# Patient Record
Sex: Male | Born: 1960 | Race: White | Hispanic: No | Marital: Married | Smoking: Former smoker
Health system: Southern US, Community
[De-identification: ages and names within clinical notes are randomized; demographics above are authoritative.]

## PROBLEM LIST (undated history)

## (undated) DIAGNOSIS — K219 Gastro-esophageal reflux disease without esophagitis: Secondary | ICD-10-CM

## (undated) DIAGNOSIS — C3490 Malignant neoplasm of unspecified part of unspecified bronchus or lung: Secondary | ICD-10-CM

## (undated) DIAGNOSIS — Z923 Personal history of irradiation: Secondary | ICD-10-CM

## (undated) HISTORY — DX: Gastro-esophageal reflux disease without esophagitis: K21.9

## (undated) HISTORY — PX: APPENDECTOMY: SHX54

---

## 1999-07-30 ENCOUNTER — Encounter: Payer: Self-pay | Admitting: *Deleted

## 1999-07-30 ENCOUNTER — Emergency Department (HOSPITAL_COMMUNITY): Admission: EM | Admit: 1999-07-30 | Discharge: 1999-07-30 | Payer: Self-pay | Admitting: *Deleted

## 2013-07-09 ENCOUNTER — Encounter: Payer: Self-pay | Admitting: Internal Medicine

## 2013-07-09 ENCOUNTER — Ambulatory Visit (INDEPENDENT_AMBULATORY_CARE_PROVIDER_SITE_OTHER): Payer: BC Managed Care – PPO | Admitting: Internal Medicine

## 2013-07-09 ENCOUNTER — Ambulatory Visit (INDEPENDENT_AMBULATORY_CARE_PROVIDER_SITE_OTHER)
Admission: RE | Admit: 2013-07-09 | Discharge: 2013-07-09 | Disposition: A | Discharge status: Home / Self Care | Payer: BC Managed Care – PPO | Source: Ambulatory Visit | Attending: Internal Medicine | Admitting: Internal Medicine

## 2013-07-09 VITALS — BP 130/88 | HR 72 | Ht 71.5 in | Wt 176.4 lb

## 2013-07-09 DIAGNOSIS — Z720 Tobacco use: Secondary | ICD-10-CM

## 2013-07-09 DIAGNOSIS — R9389 Abnormal findings on diagnostic imaging of other specified body structures: Secondary | ICD-10-CM

## 2013-07-09 DIAGNOSIS — R918 Other nonspecific abnormal finding of lung field: Secondary | ICD-10-CM

## 2013-07-09 DIAGNOSIS — R1310 Dysphagia, unspecified: Secondary | ICD-10-CM

## 2013-07-09 DIAGNOSIS — F17200 Nicotine dependence, unspecified, uncomplicated: Secondary | ICD-10-CM

## 2013-07-09 NOTE — Assessment & Plan Note (Signed)
Chest x-ray abnormal, see report below. Plan: CT chest with   Pulmonary referral Patient aware he has 3 pulmonary nodules and needs further workup -- IMPRESSION:  Right upper and right lower lobe pulmonary nodules and possible  left lower lobe nodule with bulky mediastinal lymphadenopathy.  Findings are likely due to metastatic lung carcinoma. Recommend CT  chest with contrast for further evaluation.

## 2013-07-09 NOTE — Assessment & Plan Note (Addendum)
52 year old gentleman, heavy tobacco user weight 4-5 weeks history of dysphasia. He has a history of heartburn but symptoms are described as chronic but very mild. DDX includes peptic stricture, esophagitis malignancy,  others. Plan:  Labs, Chest x-ray, PPIs GI referral Addendum: Chest x-ray abnormal, cancel GI referral. See below

## 2013-07-09 NOTE — Progress Notes (Signed)
  Subjective:    Patient ID: Ray Burns, male    DOB: 1961-11-11, 52 y.o.   MRN: [id number]  HPI New patient, here with following concern. About 5 weeks ago he was eating a [employer], swallow a piece of a sandwich but it got "stuck" on his chest and he had difficulty swallowing, he had to take some fluids and eventually it "squeezed into the stomach". Ever since that episode he has dysphagia to solids and liquids described as difficulty swallowing, some pressure. Also his stomach is slightly bloated. He has a history of heartburn for a long time but symptoms are actually minimal, he has not reached for medication in a while. He is a heavy tobacco user, change smoking to smokeless tobacco 4 years ago.  Past Medical History  Diagnosis Date  . GERD (gastroesophageal reflux disease)     mild   Past Surgical History  Procedure Laterality Date  . Appendectomy      52 y/o   History   Social History  . Marital Status: Single    Spouse Name: N/A    Number of Children: 3  . Years of Education: N/A   Occupational History  . carpenter     Social History Main Topics  . Smoking status: Former Games developer  . Smokeless tobacco: Current User     Comment: smoked 1 ppd, change to smokeless ~ 2010  . Alcohol Use: Yes     Comment: 3 times a week, 6 beers  . Drug Use: Yes  . Sexually Active: Not on file   Other Topics Concern  . Not on file   Social History Narrative   Married    Family History  Problem Relation Age of Onset  . CAD Father   . Stroke Neg Hx   . Diabetes Mother   . Lung cancer Father     smoker  . Prostate cancer Neg Hx   . Colon cancer Sister      Review of Systems No fever or chills, no weight loss. No  nausea, vomiting, diarrhea or change in the color of the stools. No history of peptic ulcer disease. Not taken any Motrin or similar medications. Occasional cough ever since his symptoms started. No hemoptysis. Never had a EGD    Objective:   Physical Exam BP  130/88  Pulse 72  Ht 5' 11.5" (1.816 m)  Wt 176 lb 6.4 oz (80.015 kg)  BMI 24.26 kg/m2  SpO2 95%  General -- alert, well-developed, NAD.   Neck --no thyromegaly , no mass or neck LADs; No supraclavicular mass. HEENT --  Poor dentition Lungs -- normal respiratory effort, no intercostal retractions, no accessory muscle use, and normal breath sounds.   Heart-- normal rate, regular rhythm, no murmur, and no gallop.   Abdomen--soft, non-tender, no distention, no masses, no HSM, no guarding, and no rigidity.   Extremities-- no pretibial edema bilaterally Psych-- Cognition and judgment appear intact. Alert and cooperative with normal attention span and concentration.  not anxious appearing and not depressed appearing.       Assessment & Plan:

## 2013-07-09 NOTE — Assessment & Plan Note (Signed)
Risk of oral, pharyngeal , lung cancer discussed. Information provided about quitting. Will discuss further when he comes back to the office. Recommend to see his dentist routinely.

## 2013-07-09 NOTE — Patient Instructions (Addendum)
Prilosec 20 mg OTC one tablet before breakfast every day. We are sending you to her stomach doctor., if you don't hear from [place] in 5 days, please call our office. Think about quitting tobacco You need to see a dentist for regular dental care Schedule a complete physical at your convenience --- Please get your x-ray at the other [place]  office located at: [address] [place], across from [facility].  Please go to the basement, this is a walk-in facility, they are open from 8:30 to 5:30 PM. Phone number [phone].

## 2013-07-10 LAB — CBC WITH DIFFERENTIAL/PLATELET
Basophils Absolute: 0 10*3/uL (ref 0.0–0.1)
Basophils Relative: 0.3 % (ref 0.0–3.0)
Eosinophils Absolute: 0.2 10*3/uL (ref 0.0–0.7)
Eosinophils Relative: 1.7 % (ref 0.0–5.0)
HCT: 49.2 % (ref 39.0–52.0)
Hemoglobin: 16.6 g/dL (ref 13.0–17.0)
Lymphocytes Relative: 20.9 % (ref 12.0–46.0)
Lymphs Abs: 2.1 10*3/uL (ref 0.7–4.0)
MCHC: 33.8 g/dL (ref 30.0–36.0)
MCV: 96.3 fl (ref 78.0–100.0)
Monocytes Absolute: 0.7 10*3/uL (ref 0.1–1.0)
Monocytes Relative: 6.9 % (ref 3.0–12.0)
Neutro Abs: 7.1 10*3/uL (ref 1.4–7.7)
Neutrophils Relative %: 70.2 % (ref 43.0–77.0)
Platelets: 216 10*3/uL (ref 150.0–400.0)
RBC: 5.11 Mil/uL (ref 4.22–5.81)
RDW: 12.5 % (ref 11.5–14.6)
WBC: 10.1 10*3/uL (ref 4.5–10.5)

## 2013-07-10 LAB — COMPREHENSIVE METABOLIC PANEL
ALT: 22 U/L (ref 0–53)
AST: 24 U/L (ref 0–37)
Albumin: 4.4 g/dL (ref 3.5–5.2)
Alkaline Phosphatase: 62 U/L (ref 39–117)
BUN: 17 mg/dL (ref 6–23)
CO2: 27 mEq/L (ref 19–32)
Calcium: 9.8 mg/dL (ref 8.4–10.5)
Chloride: 102 mEq/L (ref 96–112)
Creatinine, Ser: 1 mg/dL (ref 0.4–1.5)
GFR: 86.42 mL/min (ref >60.00)
Glucose, Bld: 82 mg/dL (ref 70–99)
Potassium: 4.6 mEq/L (ref 3.5–5.1)
Sodium: 136 mEq/L (ref 135–145)
Total Bilirubin: 0.8 mg/dL (ref 0.3–1.2)
Total Protein: 7.6 g/dL (ref 6.0–8.3)

## 2013-07-10 LAB — TSH: TSH: 1.43 u[IU]/mL (ref 0.35–5.50)

## 2013-07-12 ENCOUNTER — Ambulatory Visit (INDEPENDENT_AMBULATORY_CARE_PROVIDER_SITE_OTHER)
Admission: RE | Admit: 2013-07-12 | Discharge: 2013-07-12 | Disposition: A | Discharge status: Home / Self Care | Payer: BC Managed Care – PPO | Source: Ambulatory Visit | Attending: Internal Medicine | Admitting: Internal Medicine

## 2013-07-12 ENCOUNTER — Telehealth: Payer: Self-pay | Admitting: Internal Medicine

## 2013-07-12 DIAGNOSIS — R9389 Abnormal findings on diagnostic imaging of other specified body structures: Secondary | ICD-10-CM

## 2013-07-12 DIAGNOSIS — R918 Other nonspecific abnormal finding of lung field: Secondary | ICD-10-CM

## 2013-07-12 MED ORDER — IOHEXOL 300 MG/ML  SOLN
100.0000 mL | Freq: Once | INTRAMUSCULAR | Status: AC | PRN
  Administered 2013-07-12: 80 mL via INTRAVENOUS

## 2013-07-12 NOTE — Telephone Encounter (Signed)
I asked the patient to come to the office to discuss the CT results, he is here today with his wife.  I had a frank conversation with him, findings are consistent with lung cancer but we need to wait until we have a tissue diagnosis. I counseled  him to the best of my ability, reassured him there is excellent care here in [place]. I asked him to call me at anytime if he feels is getting severely depress, anxious or unable to sleep; Both the patient and his wife think  they will  be "okay". Again they will call me if needed.

## 2013-07-19 ENCOUNTER — Ambulatory Visit (INDEPENDENT_AMBULATORY_CARE_PROVIDER_SITE_OTHER): Payer: BC Managed Care – PPO | Admitting: Internal Medicine

## 2013-07-19 ENCOUNTER — Encounter: Payer: Self-pay | Admitting: Internal Medicine

## 2013-07-19 VITALS — BP 120/80 | HR 78 | Temp 98.3°F | Ht 70.5 in | Wt 179.4 lb

## 2013-07-19 DIAGNOSIS — R918 Other nonspecific abnormal finding of lung field: Secondary | ICD-10-CM

## 2013-07-19 DIAGNOSIS — R1310 Dysphagia, unspecified: Secondary | ICD-10-CM

## 2013-07-19 DIAGNOSIS — R222 Localized swelling, mass and lump, trunk: Secondary | ICD-10-CM

## 2013-07-19 NOTE — Assessment & Plan Note (Signed)
Explained by subcarinal mass > rec chewing solids, pushing more liquids until we have a change to make tissue dx and start treatment.

## 2013-07-19 NOTE — Assessment & Plan Note (Signed)
This is almost certainly Stage IV lung ca, probably an adencarcinoma > Discussed in detail all the  indications, usual  risks and alternatives  relative to the benefits with patient who agrees to proceed with bronchoscopy with biopsy.

## 2013-07-19 NOTE — Patient Instructions (Signed)
Bronchoscopy is done as outpatient (outpatient registration entrance) and nothing to eat or drink after midnight Wednesday and arrive 7 am 8/7

## 2013-07-19 NOTE — Progress Notes (Signed)
  Subjective:    Patient ID: Ray Burns, male    DOB: 07-Nov-1961 MRN: [id number]  HPI  89 yowm quit smoking 2010 with breathng difficulty resolved completely then new onset dysphagia 05/2013 with lung mass on cxr > referred by Dr [name] 07/19/2013 for pulmonary eval   07/19/2013 1st pulmonary eval cc new onset dysphagia, cough, sob x 2 months indolent onset progressive but solids= liquid and no lknown wt loss. 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 No unusual exp hx or h/o childhood pna/ asthma or knowledge of premature birth.   Sleeping ok without nocturnal  or early am exacerbation  of respiratory  c/o's or need for noct saba. Also denies any obvious fluctuation of symptoms with weather or environmental changes or other aggravating or alleviating factors except as outlined above     Review of Systems  Constitutional: Negative for fever, chills, activity change, appetite change and unexpected weight change.  HENT: Positive for trouble swallowing. Negative for congestion, sore throat, rhinorrhea, sneezing, dental problem, voice change and postnasal drip.   Eyes: Negative for visual disturbance.  Respiratory: Positive for cough and shortness of breath. Negative for choking.   Cardiovascular: Negative for chest pain and leg swelling.  Gastrointestinal: Negative for nausea, vomiting and abdominal pain.  Genitourinary: Negative for difficulty urinating.  Musculoskeletal: Negative for arthralgias.  Skin: Negative for rash.  Psychiatric/Behavioral: Negative for behavioral problems and confusion.       Objective:   Physical Exam  Anxious but very pleasant amb wm nad Wt Readings from Last 3 Encounters:  07/19/13 179 lb 6.4 oz (81.375 kg)  07/09/13 176 lb 6.4 oz (80.015 kg)     HEENT mild turbinate edema.  Oropharynx no thrush or excess pnd or  cobblestoning.  No JVD or cervical adenopathy. Mild accessory muscle hypertrophy. Trachea midline, nl thryroid. Chest was hyperinflated by percussion with diminished breath sounds and moderate increased exp time without wheeze. Hoover sign positive at mid inspiration. Regular rate and rhythm without murmur gallop or rub or increase P2 or edema.  Abd: no hsm, nl excursion. Ext warm without cyanosis or clubbing.     Ct  07/12/13 1. Examination is positive for bilateral pulmonary metastases.  2. There is a very large subcarinal mass which has mass effect  upon the esophagus and the bronchi. This likely accounts for the  patient's dysphagia.  3. Mediastinal, bilateral hilar lymph, and gastrohepatic ligament  node metastases.  4. Right adrenal gland metastases       Assessment & Plan:

## 2013-07-25 ENCOUNTER — Encounter (HOSPITAL_COMMUNITY)
Admission: RE | Disposition: A | Discharge status: Home / Self Care | Payer: Self-pay | Source: Ambulatory Visit | Attending: Internal Medicine

## 2013-07-25 ENCOUNTER — Ambulatory Visit (HOSPITAL_COMMUNITY)
Admission: RE | Admit: 2013-07-25 | Discharge: 2013-07-25 | Disposition: A | Discharge status: Home / Self Care | Payer: BC Managed Care – PPO | Source: Ambulatory Visit | Attending: Internal Medicine | Admitting: Internal Medicine

## 2013-07-25 ENCOUNTER — Encounter (HOSPITAL_COMMUNITY): Payer: Self-pay | Admitting: Respiratory Therapy

## 2013-07-25 DIAGNOSIS — R918 Other nonspecific abnormal finding of lung field: Secondary | ICD-10-CM

## 2013-07-25 DIAGNOSIS — R222 Localized swelling, mass and lump, trunk: Secondary | ICD-10-CM

## 2013-07-25 DIAGNOSIS — Z87891 Personal history of nicotine dependence: Secondary | ICD-10-CM | POA: Insufficient documentation

## 2013-07-25 DIAGNOSIS — C3490 Malignant neoplasm of unspecified part of unspecified bronchus or lung: Secondary | ICD-10-CM | POA: Insufficient documentation

## 2013-07-25 HISTORY — PX: VIDEO BRONCHOSCOPY: SHX5072

## 2013-07-25 SURGERY — VIDEO BRONCHOSCOPY WITHOUT FLUORO
Anesthesia: Moderate Sedation | Laterality: Bilateral

## 2013-07-25 MED ORDER — PHENYLEPHRINE HCL 0.25 % NA SOLN
1.0000 | Freq: Four times a day (QID) | NASAL | Status: DC | PRN
  Filled 2013-07-25: qty 15

## 2013-07-25 MED ORDER — LIDOCAINE HCL 2 % EX GEL
CUTANEOUS | Status: DC | PRN
  Administered 2013-07-25: 1 via TOPICAL

## 2013-07-25 MED ORDER — PHENYLEPHRINE HCL 0.25 % NA SOLN: 1.0000 | Freq: Four times a day (QID) | NASAL | Status: DC | PRN

## 2013-07-25 MED ORDER — LIDOCAINE HCL (PF) 1 % IJ SOLN: INTRAMUSCULAR | Status: DC | PRN

## 2013-07-25 MED ORDER — LIDOCAINE HCL 2 % EX GEL: Freq: Once | CUTANEOUS | Status: DC

## 2013-07-25 MED ORDER — MEPERIDINE HCL 25 MG/ML IJ SOLN
INTRAMUSCULAR | Status: DC | PRN
  Administered 2013-07-25: 50 mg via INTRAVENOUS

## 2013-07-25 MED ORDER — MIDAZOLAM HCL 10 MG/2ML IJ SOLN
INTRAMUSCULAR | Status: AC
  Filled 2013-07-25: qty 4

## 2013-07-25 MED ORDER — SODIUM CHLORIDE 0.9 % IV SOLN
INTRAVENOUS | Status: DC
  Administered 2013-07-25: 08:00:00 via INTRAVENOUS

## 2013-07-25 MED ORDER — PHENYLEPHRINE HCL 0.25 % NA SOLN
NASAL | Status: DC | PRN
  Administered 2013-07-25: 2

## 2013-07-25 MED ORDER — LIDOCAINE HCL 1 % IJ SOLN
INTRAMUSCULAR | Status: DC | PRN
  Administered 2013-07-25: 6 mL via RESPIRATORY_TRACT

## 2013-07-25 MED ORDER — MEPERIDINE HCL 100 MG/ML IJ SOLN
INTRAMUSCULAR | Status: AC
  Filled 2013-07-25: qty 2

## 2013-07-25 MED ORDER — EPINEPHRINE HCL 0.1 MG/ML IJ SOSY
PREFILLED_SYRINGE | INTRAMUSCULAR | Status: DC | PRN
  Administered 2013-07-25: 1 mg via INTRAVENOUS

## 2013-07-25 MED ORDER — LIDOCAINE HCL 2 % EX GEL
Freq: Once | CUTANEOUS | Status: DC
  Filled 2013-07-25: qty 5

## 2013-07-25 MED ORDER — MIDAZOLAM HCL 10 MG/2ML IJ SOLN
INTRAMUSCULAR | Status: DC | PRN
  Administered 2013-07-25 (×2): 2.5 mg via INTRAVENOUS

## 2013-07-25 NOTE — Progress Notes (Signed)
Video Bronchoscopy Done  Intervention Bronchial biopsy Intervention Bronchial washing   Procedure tolerated well

## 2013-07-25 NOTE — Op Note (Signed)
Bronchoscopy Procedure Note  Date of Operation: 07/25/2013   Pre-op Diagnosis: Lung mass  Post-op Diagnosis: same  Surgeon: Sandrea Hughs  Anesthesia: Monitored Local Anesthesia with Sedation  Operation: Video Flexible fiberoptic bronchoscopy, diagnostic   Findings: Studding of Bronchus intermedius with fungating polypoid lesions, no airway obst  Specimen: Endobronchial Bx, washing  Estimated Blood Loss: min   Complications: min bleeding at bx site, controlled with epi x one  Indications and History: See updated H and P same date. The risks, benefits, complications, treatment options and expected outcomes were discussed with the patient.  The possibilities of reaction to medication, pulmonary aspiration, perforation of a viscus, bleeding, failure to diagnose a condition and creating a complication requiring transfusion or operation were discussed with the patient who freely signed the consent.    Description of Procedure: The patient was re-examined in the bronchoscopy suite and the site of surgery properly noted/marked.  The patient was identified  and the procedure verified as Flexible Fiberoptic Bronchoscopy.  A Time Out was held and the above information confirmed.   After the induction of topical nasopharyngeal anesthesia, the patient was positioned  and the bronchoscope was passed through the R naris. The vocal cords were visualized and  1% buffered lidocaine 5 ml was topically placed onto the cords. The cords were nl. The scope was then passed into the trachea.  1% buffered lidocaine given topically. Airways inspected bilaterally to the subsegmental level with the following findings:  Airways nl to subsegmental level x for several endobronchial polypoid lesions BI with friable mucosa, cauliflower in texture  Interventions Washing BI Endobronchial bx x 5    The Patient was taken to the Endoscopy Recovery area in satisfactory condition.  Attestation: I performed the  procedure.  Sandrea Hughs, MD Pulmonary and Critical Care Medicine [facility] Cell [phone] After 5:30 PM or weekends, call [phone]

## 2013-07-25 NOTE — H&P (Signed)
pHPI   51 yowm quit smoking 2010 with breathng difficulty resolved completely then new onset dysphagia 05/2013 with lung mass on cxr > referred by Dr [name] 07/19/2013 for pulmonary eval    07/19/2013 1st pulmonary eval cc new onset dysphagia, cough, sob x 2 months indolent onset progressive but solids= liquid and no lknown wt loss. 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 No unusual exp hx or h/o childhood pna/ asthma or knowledge of premature birth.    Sleeping ok without nocturnal  or early am exacerbation  of respiratory  c/o's or need for noct saba. Also denies any obvious fluctuation of symptoms with weather or environmental changes or other aggravating or alleviating factors except as outlined above        Review of Systems  Constitutional: Negative for fever, chills, activity change, appetite change and unexpected weight change.  HENT: Positive for trouble swallowing. Negative for congestion, sore throat, rhinorrhea, sneezing, dental problem, voice change and postnasal drip.   Eyes: Negative for visual disturbance.  Respiratory: Positive for cough and shortness of breath. Negative for choking.   Cardiovascular: Negative for chest pain and leg swelling.  Gastrointestinal: Negative for nausea, vomiting and abdominal pain.  Genitourinary: Negative for difficulty urinating.  Musculoskeletal: Negative for arthralgias.  Skin: Negative for rash.  Psychiatric/Behavioral: Negative for behavioral problems and confusion.          Objective:     Physical Exam   Anxious but very pleasant amb wm nad Wt Readings from Last 3 Encounters:   07/19/13  179 lb 6.4 oz (81.375 kg)   07/09/13  176 lb 6.4 oz (80.015 kg)        HEENT mild turbinate edema.  Oropharynx no thrush or excess pnd or cobblestoning.  No JVD or cervical adenopathy. Mild  accessory muscle hypertrophy. Trachea midline, nl thryroid. Chest was hyperinflated by percussion with diminished breath sounds and moderate increased exp time without wheeze. Hoover sign positive at mid inspiration. Regular rate and rhythm without murmur gallop or rub or increase P2 or edema.  Abd: no hsm, nl excursion. Ext warm without cyanosis or clubbing.       Ct  07/12/13 1. Examination is positive for bilateral pulmonary metastases.   2. There is a very large subcarinal mass which has mass effect   upon the esophagus and the bronchi. This likely accounts for the   patient's dysphagia.   3. Mediastinal, bilateral hilar lymph, and gastrohepatic ligament   node metastases.   4. Right adrenal gland metastases           Assessment & Plan:     Lung mass -            This is almost certainly Stage IV lung ca, probably an adencarcinoma > Discussed in detail all the  indications, usual  risks and alternatives  relative to the benefits with patient who agrees to proceed with bronchoscopy with biopsy.           Dysphagia,         Explained by subcarinal mass > rec chewing solids, pushing more liquids until we have a change to make tissue dx and start treatment.               Ray Hughs, MD Pulmonary and Critical Care Medicine [facility] Cell [phone] After 5:30  PM or weekends, call [phone]

## 2013-07-26 ENCOUNTER — Encounter (HOSPITAL_COMMUNITY): Payer: Self-pay | Admitting: Internal Medicine

## 2013-07-29 ENCOUNTER — Encounter: Payer: Self-pay | Admitting: Internal Medicine

## 2013-07-29 ENCOUNTER — Telehealth: Payer: Self-pay | Admitting: Internal Medicine

## 2013-07-29 ENCOUNTER — Other Ambulatory Visit: Payer: Self-pay | Admitting: Internal Medicine

## 2013-07-29 DIAGNOSIS — R918 Other nonspecific abnormal finding of lung field: Secondary | ICD-10-CM

## 2013-07-29 NOTE — Telephone Encounter (Signed)
Pt is requesting BX results. Please advise MW thanks

## 2013-07-30 ENCOUNTER — Telehealth: Payer: Self-pay | Admitting: *Deleted

## 2013-07-30 NOTE — Telephone Encounter (Signed)
Spoke with wife regarding [facility] appt 08/01/13 at 1:00 arrive at 12:45.  She verbalized understanding of time and place of appt

## 2013-08-01 ENCOUNTER — Encounter: Payer: Self-pay | Admitting: *Deleted

## 2013-08-01 ENCOUNTER — Ambulatory Visit
Admission: RE | Admit: 2013-08-01 | Discharge: 2013-08-01 | Disposition: A | Discharge status: Home / Self Care | Payer: BC Managed Care – PPO | Source: Ambulatory Visit | Attending: Radiation Oncology | Admitting: Radiation Oncology

## 2013-08-01 ENCOUNTER — Encounter: Payer: Self-pay | Admitting: Internal Medicine

## 2013-08-01 ENCOUNTER — Ambulatory Visit: Payer: BC Managed Care – PPO | Admitting: Physical Therapy

## 2013-08-01 ENCOUNTER — Ambulatory Visit (HOSPITAL_BASED_OUTPATIENT_CLINIC_OR_DEPARTMENT_OTHER): Payer: BC Managed Care – PPO | Admitting: Internal Medicine

## 2013-08-01 VITALS — BP 134/83 | HR 82 | Temp 98.5°F | Resp 18 | Ht 70.5 in | Wt 174.6 lb

## 2013-08-01 VITALS — BP 131/85 | HR 72 | Temp 98.6°F | Ht 70.5 in | Wt 174.8 lb

## 2013-08-01 DIAGNOSIS — K208 Other esophagitis without bleeding: Secondary | ICD-10-CM | POA: Insufficient documentation

## 2013-08-01 DIAGNOSIS — Z79899 Other long term (current) drug therapy: Secondary | ICD-10-CM | POA: Insufficient documentation

## 2013-08-01 DIAGNOSIS — C3490 Malignant neoplasm of unspecified part of unspecified bronchus or lung: Secondary | ICD-10-CM | POA: Insufficient documentation

## 2013-08-01 DIAGNOSIS — Z510 Encounter for antineoplastic radiation therapy: Secondary | ICD-10-CM | POA: Insufficient documentation

## 2013-08-01 DIAGNOSIS — R1310 Dysphagia, unspecified: Secondary | ICD-10-CM

## 2013-08-01 HISTORY — DX: Malignant neoplasm of unspecified part of unspecified bronchus or lung: C34.90

## 2013-08-01 NOTE — Progress Notes (Signed)
[facility] [phone]   [phone]  CONSULT NOTE  REFERRING PHYSICIAN: Dr. Sandrea Hughs  REASON FOR CONSULTATION:  52 years old white male recently diagnosed with lung cancer  HPI BLAIDEN Burns is a 52 y.o. male with no significant past medical history but long history of smoking and quit in 2010. The patient mentions that for a couple of months he has been complaining of difficulty swallowing and food stuck at the middle of his esophagus. He was seen by his primary care physician Dr. [name] and chest x-ray was performed on 07/09/2013 and showed the right upper and right lower lobe pulmonary nodules and possible left lower lobe nodules with bulky mediastinal lymphadenopathy suspicious for metastatic lung cancer. This was followed by CT scan of the chest with contrast on 07/12/2013 and it showed bilateral pulmonary nodules are identified. Nodule in the right lower lobe measures 1.6 cm. The left upper lobe nodule measures 1.8 cm. In the left lower lobe there is a 1.6 cm nodule. There is mediastinal and bilateral hilar adenopathy. Large subcarinal lymph node measures 4.8 cm. This is marked mass effect  upon the esophagus. There is also mass effect upon both bronchi and lower lobe airways. Index right hilar lymph node measures 2.3 cm. Index left hilar lymph node measures 1.1 cm. There is no axillary or supraclavicular adenopathy. Imaging through the abdomen shows a large right adrenal nodule which measures 2.4 x 3.3 cm. There is a gastrohepatic ligament lymph node which measures 2.9 cm. The patient was referred to Dr. [name] and on 07/25/2013 he underwent bronchoscopy with endobronchial biopsy and washings.  The final pathology (Accession: [id number]) was consistent with small cell carcinoma. There is an atypical infiltrate with features of small cell carcinoma confirmed by positive staining with synaptophysin and thyroid transcription factor-1 (TTF-1). The infiltrate  is negative with chromogranin, CD56, leukocyte common antigen (CD45) and cytokeratin AE1/AE3. Dr. [name] kindly referred the patient to me today for further evaluation and recommendation regarding treatment of his condition. The patient continues to have pain in the center of his chest as well as difficulty swallowing but no significant weight loss. He also continues to have dry cough with shortness breath with exertion but no hemoptysis. He denied having any significant headache or visual changes. Family history significant for a father who died from lung cancer and a sister with colon cancer. The patient is married and was accompanied today by his wife, [name]. He has 3 children age 35, 3 and 6 weeks. He works as a Music therapist. He has a history of smoking one pack per day for around 30 years but quit in 2010. He also chews tobacco and drinks 1-2 alcoholic drinks every other day. He has no history of drug abuse.    @SFHPI @  Past Medical History  Diagnosis Date  . GERD (gastroesophageal reflux disease)     mild    Past Surgical History  Procedure Laterality Date  . Appendectomy      52 y/o  . Video bronchoscopy Bilateral 07/25/2013    Procedure: VIDEO BRONCHOSCOPY WITHOUT FLUORO;  Surgeon: [name], MD;  Location: [facility];  Service: Cardiopulmonary;  Laterality: Bilateral;    Family History  Problem Relation Age of Onset  . CAD Father   . Stroke Neg Hx   . Diabetes Mother   . Lung cancer Father     smoker  . Prostate cancer Neg Hx   . Colon cancer Sister  Social History History  Substance Use Topics  . Smoking status: Former Smoker -- 1.00 packs/day for 30 years    Types: Cigarettes    Quit date: 12/19/2008  . Smokeless tobacco: Current User    Types: Chew  . Alcohol Use: Yes     Comment: 3 times a week, 6 beers    No Known Allergies  Current Outpatient Prescriptions  Medication Sig Dispense Refill  . omeprazole (PRILOSEC OTC) 20 MG tablet Take 20 mg  by mouth daily.       No current facility-administered medications for this visit.    Review of Systems  A comprehensive review of systems was negative except for: Constitutional: positive for fatigue Respiratory: positive for cough and dyspnea on exertion Gastrointestinal: positive for dyspepsia and dysphagia  Physical Exam  WJX:BJYNW, healthy, no distress, well nourished and well developed SKIN: skin color, texture, turgor are normal HEAD: Normocephalic, No masses, lesions, tenderness or abnormalities EYES: normal, PERRLA EARS: External ears normal OROPHARYNX:no exudate and no erythema  NECK: supple, no adenopathy LYMPH:  no palpable lymphadenopathy, no hepatosplenomegaly LUNGS: clear to auscultation  HEART: regular rate & rhythm, no murmurs and no gallops ABDOMEN:abdomen soft, non-tender, normal bowel sounds and no masses or organomegaly BACK: Back symmetric, no curvature. EXTREMITIES:no joint deformities, effusion, or inflammation, no edema, no skin discoloration  NEURO: alert & oriented x 3 with fluent speech, no focal motor/sensory deficits, gait normal Skin exam showed a palpable mass under the skin of back of the left shoulder with oozing of pus when pressured. The patient mentions that he has a poor more than one year.  PERFORMANCE STATUS: ECOG 1  LABORATORY DATA: Lab Results  Component Value Date   WBC 10.1 07/09/2013   HGB 16.6 07/09/2013   HCT 49.2 07/09/2013   MCV 96.3 07/09/2013   PLT 216.0 07/09/2013      Chemistry      Component Value Date/Time   NA 136 07/09/2013 1446   K 4.6 07/09/2013 1446   CL 102 07/09/2013 1446   CO2 27 07/09/2013 1446   BUN 17 07/09/2013 1446   CREATININE 1.0 07/09/2013 1446      Component Value Date/Time   CALCIUM 9.8 07/09/2013 1446   ALKPHOS 62 07/09/2013 1446   AST 24 07/09/2013 1446   ALT 22 07/09/2013 1446   BILITOT 0.8 07/09/2013 1446       RADIOGRAPHIC STUDIES: Dg Chest 2 View  07/09/2013   *RADIOLOGY REPORT*  Clinical  Data: Patient.  CHEST - 2 VIEW  Comparison: None.  Findings: The patient has a right upper lobe pulmonary nodule measuring 2.5 cm in diameter.  A second nodular opacity projects in the right lower lobe and measures 1.5 cm. A third nodular opacity projects over the lower left chest.  This could be within the lung parenchyma but may be the patient's nipple.  Calcified granuloma left upper lobe noted.  There is bulky mediastinal lymphadenopathy. No pleural effusion or pneumothorax is seen.  Heart size is normal.  IMPRESSION: Right upper and right lower lobe pulmonary nodules and possible left lower lobe nodule with bulky mediastinal lymphadenopathy. Findings are likely due to metastatic lung carcinoma.  Recommend CT chest with contrast for further evaluation.  These results will be called to the ordering clinician or representative by the Radiologist Assistant, and communication documented in the PACS Dashboard.   Original Report Authenticated By: Holley Dexter, M.D.   Ct Chest W Contrast  07/12/2013   *RADIOLOGY REPORT*  Clinical Data: Smoker.  Evaluate for cancer.  CT CHEST WITH CONTRAST  Technique:  Multidetector CT imaging of the chest was performed following the standard protocol during bolus administration of intravenous contrast.  Contrast: 80mL OMNIPAQUE IOHEXOL 300 MG/ML  SOLN  Comparison: None.  Findings: No pleural effusion identified.  Bilateral pulmonary nodules are identified.  Nodule in the right lower lobe measures 1.6 cm, image 49/series 3.  The left upper lobe nodule measures 1.8 cm, image 21/series 3.  In the left lower lobe there is a 1.6 cm nodule, image 47/series 3.  The heart size is normal.  No pericardial effusion identified. There is mediastinal and bilateral hilar adenopathy.  Large subcarinal lymph node measures 4.8 cm.  This is marked mass effect upon the esophagus.  There is also mass effect upon both bronchi and lower lobe airways.  Index right hilar lymph node measures 2.3 cm,  image 30/series 2.  Index left hilar lymph node measures 1.1 cm, image 36/series 2.  There is no axillary or supraclavicular adenopathy.  Imaging through the abdomen shows a large right adrenal nodule which measures 2.4 x 3.3 cm, image 62/series 2.  There is a gastrohepatic ligament lymph node which measures 2.9 cm, image 59/series 2.  No focal liver lesions are identified.  Calcified granulomas are identified within the spleen.  Within the subcutaneous soft tissues of the posterior chest wall there is a 2.2 cm, peripherally enhancing structure, image 5/series 2.  This may represent either a sebaceous cyst or area of metastasis.  Review of the visualized bony structures is negative for aggressive lytic or sclerotic bone lesions.  IMPRESSION:  1.  Examination is positive for bilateral pulmonary metastases. 2.  There is a very large subcarinal mass which has mass effect upon the esophagus and the bronchi.  This likely accounts for the patient's dysphagia. 3.  Mediastinal, bilateral hilar lymph, and gastrohepatic ligament node metastases. 4.  Right adrenal gland metastases   Original Report Authenticated By: Signa Kell, M.D.    ASSESSMENT: This is a very pleasant unfortunate 52 years old white male recently diagnosed with extensive stage small cell lung cancer presented with large subcarinal lymphadenopathy causing dysphagia and shortness of breath in addition to bilateral pulmonary nodules as well as retroperitoneal and adrenal gland metastasis.   PLAN: I had a lengthy discussion with the patient and his wife today about his current disease stage, prognosis and treatment options. I will complete the staging workup by ordering a PET scan as well as MRI of the brain.  I will refer the patient to Gen. Surgery for consideration of excision of the subcutaneous left shoulder cyst before starting chemotherapy. I would also referred the patient to radiation oncology and he would be seen later today by Dr. Mitzi Hansen  for consideration of palliative radiotherapy to the mediastinal lymph nodes for relief of the dysphagia and shortness of breath. I have a lengthy discussion with the patient about his treatment options including systemic chemotherapy versus palliative care and hospice. The patient is interested in systemic chemotherapy and I would consider him for a regimen consisting of carboplatin for AUC of 5 on day 1 and etoposide at 120 mg/M2 on days 1, 2 and 3 with Neulasta support on day 4.. I expect the patient to start the first cycle of this treatment on 08/12/2013 after completion of the staging workup and receiving some palliative radiotherapy. I discussed with the patient adverse effect of the chemotherapy including but not limited to alopecia, myelosuppression, nausea and vomiting, peripheral neuropathy,  liver or renal dysfunction. The patient would like to proceed with treatment as planned. He would come back for follow up visit with the start of the first cycle of his chemotherapy I will call his pharmacy with prescription for Compazine 10 mg by mouth every 6 hours as needed for nausea. The patient voices understanding of current disease status and treatment options and is in agreement with the current care plan.  All questions were answered. The patient knows to call the clinic with any problems, questions or concerns. We can certainly see the patient much sooner if necessary.  Thank you so much for allowing me to participate in the care of Ray Burns. I will continue to follow up the patient with you and assist in his care.  I spent 45 minutes counseling the patient face to face. The total time spent in the appointment was 60 minutes.  Kamarius Buckbee K. 08/01/2013, 1:18 PM

## 2013-08-01 NOTE — Progress Notes (Signed)
Ray Burns here for nurse evaluation after CT SIM.  He is alert and oriented to person, place and time.  He denies pain.

## 2013-08-01 NOTE — Progress Notes (Signed)
[facility] Clinical Social Work  Clinical Social Work met with patient and patient spouse during [place] for assessment of psychosocial needs. Medical oncologist reviewed patient's diagnosis and treatment plan with patient/family.  Mr. Duffus had minimal questions, patient's spouse expresses feeling "in shock" at the moment regarding patient diagnosis.  The patient desires to work as long as possible, CSW briefly discussed options for disability assistance if needed. Mr. Katich has three children ages 78, 35, and 7 weeks.  Mrs. Lowden shares patient's 52 year old is very "compassionate" and often worries.  CSW discussed developmentally appropriate ways to communicate with children about cancer.  CSW will follow up with patient and spouse to provide additional information.   Kathrin Penner, MSW, LCSW Clinical Social Worker [facility] [phone]

## 2013-08-01 NOTE — Progress Notes (Signed)
   Thoracic Treatment Summary Name: Ray Burns Date: 08/01/13 DOB: 06/06/1961 Your Medical Team Medical Oncologist: Dr. Arbutus Ped Radiation Oncologist: Dr. Mitzi Hansen Pulmonologist: Dr. [name] Surgeon: Type and Stage of Lung Cancer Small Cell Carcinoma: Extensive Stage   Clinical stage is based on radiology exams.  Pathological stage will be determined after surgery.  Staging is based on the size of the tumor, involvement of lymph nodes or not, and whether or not the cancer center has spread. Recommendations Recommendations: Radiation therapy, Chemotherapy,  Cyst evaluated by [facility]  These recommendations are based on information available as of today's consult.  This is subject to change depending further testing or exams. Next Steps Next Step: 1. Radiation therapy today 2. Scheduler for Medical oncology will set up appointments for chemo, [facility] evaluation, and scans  Barriers to Care What do you perceive as a potential barrier that may prevent you from receiving your treatment plan? Nothing perceived at this time Resources: Smoking cessation 1-800-QUIT-NOW [facility] lung cancer booklet information Cancer Care www.cancercare.org Questions Willette Pa, RN BSN Thoracic Oncology Nurse Navigator at [phone]  Ray Burns is a nurse navigator that is available to assist you through your cancer journey.  She can answer your questions and/or provide resources regarding your treatment plan, emotional support, or financial concerns.

## 2013-08-02 ENCOUNTER — Telehealth: Payer: Self-pay | Admitting: *Deleted

## 2013-08-02 NOTE — Telephone Encounter (Signed)
Per staff message and POF I have scheduled appts. Not able to schedule appt for 8/25 due to another MD visit. Scheduler notified.   JMW

## 2013-08-03 ENCOUNTER — Other Ambulatory Visit: Payer: Self-pay | Admitting: Hematology & Oncology

## 2013-08-03 MED ORDER — PROCHLORPERAZINE MALEATE 10 MG PO TABS: 10.0000 mg | ORAL_TABLET | Freq: Four times a day (QID) | ORAL | Status: DC | PRN

## 2013-08-05 ENCOUNTER — Telehealth: Payer: Self-pay | Admitting: *Deleted

## 2013-08-05 ENCOUNTER — Ambulatory Visit
Admission: RE | Admit: 2013-08-05 | Discharge: 2013-08-05 | Disposition: A | Discharge status: Home / Self Care | Payer: BC Managed Care – PPO | Source: Ambulatory Visit | Attending: Radiation Oncology | Admitting: Radiation Oncology

## 2013-08-05 ENCOUNTER — Other Ambulatory Visit: Payer: Self-pay | Admitting: *Deleted

## 2013-08-05 DIAGNOSIS — C3490 Malignant neoplasm of unspecified part of unspecified bronchus or lung: Secondary | ICD-10-CM

## 2013-08-05 NOTE — Progress Notes (Signed)
  Radiation Oncology         [phone] ________________________________  Name: Ray Burns MRN: [id number]  Date: 08/05/2013  DOB: 26-Apr-1961  Simulation Verification Note  Status: outpatient  NARRATIVE: The patient was brought to the treatment unit and placed in the planned treatment position. The clinical setup was verified. Then port films were obtained and uploaded to the radiation oncology medical record software.  The treatment beams were carefully compared against the planned radiation fields. The position location and shape of the radiation fields was reviewed. They targeted volume of tissue appears to be appropriately covered by the radiation beams. Organs at risk appear to be excluded as planned.  Based on my personal review, I approved the simulation verification. The patient's treatment will proceed as planned.  -----------------------------------  Billie Lade, PhD, MD

## 2013-08-05 NOTE — Telephone Encounter (Signed)
Spoke with pt wife today regarding appt clarification.  Update scheduled appt.  She verbalized understanding

## 2013-08-06 ENCOUNTER — Ambulatory Visit
Admission: RE | Admit: 2013-08-06 | Discharge: 2013-08-06 | Disposition: A | Discharge status: Home / Self Care | Payer: BC Managed Care – PPO | Source: Ambulatory Visit | Attending: Radiation Oncology | Admitting: Radiation Oncology

## 2013-08-06 ENCOUNTER — Encounter: Payer: Self-pay | Admitting: *Deleted

## 2013-08-06 ENCOUNTER — Other Ambulatory Visit: Payer: BC Managed Care – PPO

## 2013-08-06 VITALS — BP 130/82 | HR 76 | Temp 98.4°F | Ht 70.5 in | Wt 177.8 lb

## 2013-08-06 DIAGNOSIS — C3490 Malignant neoplasm of unspecified part of unspecified bronchus or lung: Secondary | ICD-10-CM

## 2013-08-06 MED ORDER — RADIAPLEXRX EX GEL
Freq: Once | CUTANEOUS | Status: AC
  Administered 2013-08-06: 11:00:00 via TOPICAL

## 2013-08-06 NOTE — Progress Notes (Signed)
Ray Burns here for weekly under treat visit.  He has had 2 fractions to his chest.  His having pain in his midsternal area that he is rating at a 5/10.  He says the pain sometimes radiates to his right side.  He is taking tylenol for this.  He denies skin redness and fatigue.  He was given the radiation therapy and you book and discussed side effects like fatigue, skin changes and throat changes.  He was given radiaplex gel and was instructed to apply in 2 times per day, after treatment and at bedtime.

## 2013-08-06 NOTE — Addendum Note (Signed)
Encounter addended by: Eduardo Osier, RN on: 08/06/2013 10:58 AM<BR>     Documentation filed: Orders

## 2013-08-06 NOTE — Addendum Note (Signed)
Encounter addended by: Eduardo Osier, RN on: 08/06/2013 11:04 AM<BR>     Documentation filed: Inpatient MAR

## 2013-08-06 NOTE — Progress Notes (Signed)
   Department of Radiation Oncology  Phone:  [phone] Fax:        [phone]  Weekly Treatment Note    Name: [name] Date: 08/06/2013 MRN: [id number] DOB: 02-Mar-1961   Current dose: 5 Gy  Current fraction: 2   MEDICATIONS: Current Outpatient Prescriptions  Medication Sig Dispense Refill  . acetaminophen (TYLENOL) 500 MG tablet Take 500 mg by mouth every 6 (six) hours as needed for pain.      Marland Kitchen omeprazole (PRILOSEC OTC) 20 MG tablet Take 20 mg by mouth daily.      . prochlorperazine (COMPAZINE) 10 MG tablet Take 1 tablet (10 mg total) by mouth every 6 (six) hours as needed.  30 tablet  0   No current facility-administered medications for this encounter.     ALLERGIES: Review of patient's allergies indicates no known allergies.   LABORATORY DATA:  Lab Results  Component Value Date   WBC 10.1 07/09/2013   HGB 16.6 07/09/2013   HCT 49.2 07/09/2013   MCV 96.3 07/09/2013   PLT 216.0 07/09/2013   Lab Results  Component Value Date   NA 136 07/09/2013   K 4.6 07/09/2013   CL 102 07/09/2013   CO2 27 07/09/2013   Lab Results  Component Value Date   ALT 22 07/09/2013   AST 24 07/09/2013   ALKPHOS 62 07/09/2013   BILITOT 0.8 07/09/2013     NARRATIVE: Ray Burns was seen today for weekly treatment management. The chart was checked and the patient's films were reviewed. The patient is doing satisfactorily at this time. He had his second treatment today. Some continued pain in the midsternal region. No difficulties with treatment so far. He is scheduled to begin his chemotherapy next week.  PHYSICAL EXAMINATION: height is 5' 10.5" (1.791 m) and weight is 177 lb 12.8 oz (80.65 kg). His temperature is 98.4 F (36.9 C). His blood pressure is 130/82 and his pulse is 76.        ASSESSMENT: The patient is doing satisfactorily with treatment.  PLAN: We will continue with the patient's radiation treatment as planned.

## 2013-08-06 NOTE — Telephone Encounter (Signed)
Notes Recorded by [name], MD on 07/29/2013 at 5:29 PM Discussed with patient's wife / Dr Shirline Frees > refer to [facility]

## 2013-08-07 ENCOUNTER — Ambulatory Visit (HOSPITAL_COMMUNITY)
Admission: RE | Admit: 2013-08-07 | Discharge: 2013-08-07 | Disposition: A | Discharge status: Home / Self Care | Payer: BC Managed Care – PPO | Source: Ambulatory Visit | Attending: Internal Medicine | Admitting: Internal Medicine

## 2013-08-07 ENCOUNTER — Ambulatory Visit
Admission: RE | Admit: 2013-08-07 | Discharge: 2013-08-07 | Disposition: A | Discharge status: Home / Self Care | Payer: BC Managed Care – PPO | Source: Ambulatory Visit | Attending: Radiation Oncology | Admitting: Radiation Oncology

## 2013-08-07 ENCOUNTER — Other Ambulatory Visit: Payer: Self-pay | Admitting: Internal Medicine

## 2013-08-07 ENCOUNTER — Encounter (HOSPITAL_COMMUNITY)
Admission: RE | Admit: 2013-08-07 | Discharge: 2013-08-07 | Disposition: A | Discharge status: Home / Self Care | Payer: BC Managed Care – PPO | Source: Ambulatory Visit | Attending: Internal Medicine | Admitting: Internal Medicine

## 2013-08-07 DIAGNOSIS — C3490 Malignant neoplasm of unspecified part of unspecified bronchus or lung: Secondary | ICD-10-CM | POA: Insufficient documentation

## 2013-08-07 DIAGNOSIS — Z9889 Other specified postprocedural states: Secondary | ICD-10-CM

## 2013-08-07 DIAGNOSIS — I6789 Other cerebrovascular disease: Secondary | ICD-10-CM | POA: Insufficient documentation

## 2013-08-07 DIAGNOSIS — C7970 Secondary malignant neoplasm of unspecified adrenal gland: Secondary | ICD-10-CM | POA: Insufficient documentation

## 2013-08-07 DIAGNOSIS — R599 Enlarged lymph nodes, unspecified: Secondary | ICD-10-CM | POA: Insufficient documentation

## 2013-08-07 MED ORDER — FLUDEOXYGLUCOSE F - 18 (FDG) INJECTION
17.7000 | Freq: Once | INTRAVENOUS | Status: AC | PRN
  Administered 2013-08-07: 17.7 via INTRAVENOUS

## 2013-08-07 MED ORDER — GADOBENATE DIMEGLUMINE 529 MG/ML IV SOLN
15.0000 mL | Freq: Once | INTRAVENOUS | Status: AC | PRN
  Administered 2013-08-07: 15 mL via INTRAVENOUS

## 2013-08-08 ENCOUNTER — Telehealth: Payer: Self-pay | Admitting: *Deleted

## 2013-08-08 ENCOUNTER — Ambulatory Visit
Admission: RE | Admit: 2013-08-08 | Discharge: 2013-08-08 | Disposition: A | Discharge status: Home / Self Care | Payer: BC Managed Care – PPO | Source: Ambulatory Visit | Attending: Radiation Oncology | Admitting: Radiation Oncology

## 2013-08-08 ENCOUNTER — Telehealth: Payer: Self-pay | Admitting: Internal Medicine

## 2013-08-08 ENCOUNTER — Encounter: Payer: Self-pay | Admitting: *Deleted

## 2013-08-08 NOTE — Telephone Encounter (Signed)
s.w. pt wife and advised on all appts...ok and aware °

## 2013-08-08 NOTE — Progress Notes (Signed)
Radiation Oncology         [phone] ________________________________  Name: Ray Burns MRN: [id number]  Date: 08/01/2013  DOB: [birth year]  WU:JWJX [name], MD  Wanda Plump, MD     REFERRING PHYSICIAN: Wanda Plump, MD   DIAGNOSIS: The encounter diagnosis was Small cell lung cancer, unspecified laterality.   HISTORY OF PRESENT ILLNESS::Ray Burns is a 52 y.o. male who is seen for an initial consultation visit. The patient is seen today in multidisciplinary thoracic clinic. He has experienced some difficulty swallowing for approximately 2 months which has become progressive. He feels that food gets stuck in the middle of his esophagus. This prompted further workup including a chest x-ray which was performed on 07/09/2013. This showed right upper and right lower lobe pulmonary nodules as well as additional findings including apparent bulky mediastinal lymphadenopathy. He therefore proceeded to undergo a CT scan of the chest on 07/12/2013. This unfortunately showed bilateral pulmonary metastases as wellas a very large subcarinal mass with mass effect on both the esophagus and bronchi. A right adrenal gland metastasis was seen in addition to mediastinal, bilateral hilar, and gastrohepatic ligament nodal metastases.  The patient further proceeded with a bronchoscopy with endobronchial biopsies and washings. Final pathology was consistent with small cell carcinoma.  The patient at this time continues to complain of some pressure in the chest region. He denies any discrete pain. He denies any distant pain as well. He still has continued to be able to be with the above-noted changes but he has been successful and continuing to a fairly regular diet he states.   PREVIOUS RADIATION THERAPY: No   PAST MEDICAL HISTORY:  has a past medical history of GERD (gastroesophageal reflux disease) and Non-small cell lung cancer.     PAST SURGICAL HISTORY: Past Surgical History  Procedure Laterality Date   . Appendectomy      52 y/o  . Video bronchoscopy Bilateral 07/25/2013    Procedure: VIDEO BRONCHOSCOPY WITHOUT FLUORO;  Surgeon: [name], MD;  Location: [facility];  Service: Cardiopulmonary;  Laterality: Bilateral;     FAMILY HISTORY: family history includes CAD in his father; Colon cancer in his sister; Diabetes in his mother; Lung cancer in his father. There is no history of Stroke or Prostate cancer.   SOCIAL HISTORY:  reports that he quit smoking about 4 years ago. His smoking use included Cigarettes. He has a 30 pack-year smoking history. His smokeless tobacco use includes Chew. He reports that  drinks alcohol. He reports that he does not use illicit drugs.   ALLERGIES: Review of patient's allergies indicates no known allergies.   MEDICATIONS:  Current Outpatient Prescriptions  Medication Sig Dispense Refill  . acetaminophen (TYLENOL) 500 MG tablet Take 500 mg by mouth every 6 (six) hours as needed for pain.      Marland Kitchen omeprazole (PRILOSEC OTC) 20 MG tablet Take 20 mg by mouth daily.      . prochlorperazine (COMPAZINE) 10 MG tablet Take 1 tablet (10 mg total) by mouth every 6 (six) hours as needed.  30 tablet  0   No current facility-administered medications for this encounter.     REVIEW OF SYSTEMS:  A 15 point review of systems is documented in the electronic medical record. This was obtained by the nursing staff. However, I reviewed this with the patient to discuss relevant findings and make appropriate changes.  Pertinent items are noted in HPI.    PHYSICAL EXAM:  vitals were not  taken for this visit.  General: Well-developed, in no acute distress HEENT: Normocephalic, atraumatic; oral cavity clear Neck: Supple without any lymphadenopathy Cardiovascular: Regular rate and rhythm Respiratory: Clear to auscultation bilaterally GI: Soft, nontender, normal bowel sounds Extremities: No edema present Neuro: No focal deficits     LABORATORY DATA:  Lab Results    Component Value Date   WBC 10.1 07/09/2013   HGB 16.6 07/09/2013   HCT 49.2 07/09/2013   MCV 96.3 07/09/2013   PLT 216.0 07/09/2013   Lab Results  Component Value Date   NA 136 07/09/2013   K 4.6 07/09/2013   CL 102 07/09/2013   CO2 27 07/09/2013   Lab Results  Component Value Date   ALT 22 07/09/2013   AST 24 07/09/2013   ALKPHOS 62 07/09/2013   BILITOT 0.8 07/09/2013      RADIOGRAPHY: Dg Eye Foreign Body  08/07/2013   CLINICAL DATA:  Metal working/exposure; clearance prior to MRI  EXAM: ORBITS FOR FOREIGN BODY - 2 VIEW  COMPARISON:  Nuclear medicine PET-CT from 08/07/2013  FINDINGS: There is no evidence of metallic foreign body within the orbits. No significant bone abnormality identified.  IMPRESSION: No evidence of metallic foreign body within the orbits.   Electronically Signed   By: Herbie Baltimore   On: 08/07/2013 17:28   Dg Chest 2 View  07/09/2013   *RADIOLOGY REPORT*  Clinical Data: Patient.  CHEST - 2 VIEW  Comparison: None.  Findings: The patient has a right upper lobe pulmonary nodule measuring 2.5 cm in diameter.  A second nodular opacity projects in the right lower lobe and measures 1.5 cm. A third nodular opacity projects over the lower left chest.  This could be within the lung parenchyma but may be the patient's nipple.  Calcified granuloma left upper lobe noted.  There is bulky mediastinal lymphadenopathy. No pleural effusion or pneumothorax is seen.  Heart size is normal.  IMPRESSION: Right upper and right lower lobe pulmonary nodules and possible left lower lobe nodule with bulky mediastinal lymphadenopathy. Findings are likely due to metastatic lung carcinoma.  Recommend CT chest with contrast for further evaluation.  These results will be called to the ordering clinician or representative by the Radiologist Assistant, and communication documented in the PACS Dashboard.   Original Report Authenticated By: Holley Dexter, M.D.   Ct Chest W Contrast  07/12/2013   *RADIOLOGY  REPORT*  Clinical Data: Smoker.  Evaluate for cancer.  CT CHEST WITH CONTRAST  Technique:  Multidetector CT imaging of the chest was performed following the standard protocol during bolus administration of intravenous contrast.  Contrast: 80mL OMNIPAQUE IOHEXOL 300 MG/ML  SOLN  Comparison: None.  Findings: No pleural effusion identified.  Bilateral pulmonary nodules are identified.  Nodule in the right lower lobe measures 1.6 cm, image 49/series 3.  The left upper lobe nodule measures 1.8 cm, image 21/series 3.  In the left lower lobe there is a 1.6 cm nodule, image 47/series 3.  The heart size is normal.  No pericardial effusion identified. There is mediastinal and bilateral hilar adenopathy.  Large subcarinal lymph node measures 4.8 cm.  This is marked mass effect upon the esophagus.  There is also mass effect upon both bronchi and lower lobe airways.  Index right hilar lymph node measures 2.3 cm, image 30/series 2.  Index left hilar lymph node measures 1.1 cm, image 36/series 2.  There is no axillary or supraclavicular adenopathy.  Imaging through the abdomen shows a large right adrenal  nodule which measures 2.4 x 3.3 cm, image 62/series 2.  There is a gastrohepatic ligament lymph node which measures 2.9 cm, image 59/series 2.  No focal liver lesions are identified.  Calcified granulomas are identified within the spleen.  Within the subcutaneous soft tissues of the posterior chest wall there is a 2.2 cm, peripherally enhancing structure, image 5/series 2.  This may represent either a sebaceous cyst or area of metastasis.  Review of the visualized bony structures is negative for aggressive lytic or sclerotic bone lesions.  IMPRESSION:  1.  Examination is positive for bilateral pulmonary metastases. 2.  There is a very large subcarinal mass which has mass effect upon the esophagus and the bronchi.  This likely accounts for the patient's dysphagia. 3.  Mediastinal, bilateral hilar lymph, and gastrohepatic ligament  node metastases. 4.  Right adrenal gland metastases   Original Report Authenticated By: Signa Kell, M.D.   Mr Laqueta Jean Wo Contrast  08/07/2013   *RADIOLOGY REPORT*  Clinical Data: Small cell lung cancer.  Staging.  MRI HEAD WITHOUT AND WITH CONTRAST  Technique:  Multiplanar, multiecho pulse sequences of the brain and surrounding structures were obtained according to standard protocol without and with intravenous contrast  Contrast: 15mL MULTIHANCE GADOBENATE DIMEGLUMINE 529 MG/ML IV SOLN  Comparison: None.  Findings: Diffusion imaging does not show any acute or subacute infarction.  The brainstem and cerebellum are normal.  The cerebral hemispheres show mild to moderate chronic small vessel changes of the deep subcortical white matter, advanced for age.  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 After contrast administration, there is no abnormal enhancement. No evidence of primary or metastatic mass.  No hydrocephalus or extra-axial collection.  No pituitary abnormality.  No skull or skull base lesion.  IMPRESSION: No evidence of metastatic disease.  Age advanced ischemic changes affecting the brain.   Original Report Authenticated By: Paulina Fusi, M.D.   Nm Pet Image Initial (pi) Skull Base To Thigh  08/07/2013   *RADIOLOGY REPORT*  Clinical Data:  Initial treatment strategy for small cell lung cancer.  NUCLEAR MEDICINE PET WHOLE BODY  Fasting Blood Glucose:  86  Technique:  17.7 mCi F-18 FDG was injected intravenously. CT data was obtained and used for attenuation correction and anatomic localization only.  (This was not acquired as a diagnostic CT examination.) Additional exam technical data entered on technologist worksheet.  Comparison:  CT chest from 07/12/2013  Findings:  Head/Neck:   No hypermetabolic lymph nodes in the neck.  Chest:  The patients known of bulky thoracic disease is markedly hypermetabolic.  The large subcarinal lymph node has SUV max = 17. Multiple  bilateral pulmonary metastases are identified.  Index 11 x 17 mm lingular nodule has SUV max = 8.  Hypermetabolic lymphadenopathy is seen in the hila bilaterally, right greater than left.  The 2.2 cm low density subcutaneous lesion in the left upper paramidline back is hypermetabolic.  Abdomen/Pelvis:  3.3 cm right adrenal mass is markedly hypermetabolic with SUV max = 14.  The patient has a large gastrohepatic ligament lymph node which is markedly hypermetabolic.  No definite evidence for hypermetabolic metastases to the liver. There is a soft tissue nodule anterior to the spleen which is not hypermetabolic and a wall are present a tiny splenule.  Skeleton:  No focal hypermetabolic activity to suggest skeletal metastasis.  Extremities:  A small hypermetabolic lesion is identified in the right to triceps musculature.  IMPRESSION: Markedly hypermetabolic mediastinal  and hilar lymphadenopathy in the chest associated with bilateral hypermetabolic pulmonary metastases.  Right adrenal metastatic lesion with hypermetabolic metastatic disease to the gastrohepatic ligament.  Hypermetabolic lesion in the right triceps musculature concerning for metastatic involvement.  A water density lesion in the subcutaneous fat of the upper left paramidline back to may represent a sebaceous cyst showing increased F D G uptake secondary to superinfection or inflammation, but necrotic metastatic lesion to this location is suspected.   Original Report Authenticated By: Kennith Center, M.D.       IMPRESSION: The patient unfortunately is presenting with metastatic small cell lung cancer. He has bulky mediastinal lymphadenopathy with local mass effect and he is appropriate for palliative thoracic radiotherapy at this time. He also is being seen by Dr. Shirline Frees today who will begin chemotherapy in the near future as well for this new diagnosis. Additional staging studies have been ordered by Dr. Shirline Frees including a PET scan and a brain MRI  scan.  I would like to begin the patient's treatment on an urgent basis. We discussed the potential approximate 3 week course of palliative radiotherapy. We discussed the rationale of this treatment as well as potential side effects and risks, in particular esophagitis. All of his questions were answered and the patient does wish to proceed with this overall treatment plan.    PLAN: The patient will proceed with a simulation today such that we can proceed with treatment planning. I anticipate beginning his radiotherapy as soon as possible, early next week on Monday.    I spent 60 minutes face to face with the patient and more than 50% of that time was spent in counseling and/or coordination of care.    ________________________________   Radene Gunning, MD, PhD

## 2013-08-08 NOTE — Progress Notes (Signed)
Spoke with pt wife.  She is concerned about not making Dr. Michaell Cowing appointment at [facility].  I call [facility] to notify pt may be at appointment 15 minutes early but can not be there 30 minutes before appointments.

## 2013-08-08 NOTE — Progress Notes (Signed)
  Radiation Oncology         [phone] ________________________________  Name: [name] MRN: [id number]  Date: 08/01/2013  DOB: 1961/01/16  SIMULATION AND TREATMENT PLANNING NOTE  DIAGNOSIS:  Extensive stage small cell lung cancer  NARRATIVE:  The patient was brought to the CT Simulation planning suite.  Identity was confirmed.  All relevant records and images related to the planned course of therapy were reviewed.   Written consent to proceed with treatment was confirmed which was freely given after reviewing the details related to the planned course of therapy had been reviewed with the patient.  Then, the patient was set-up in a stable reproducible  supine position for radiation therapy.  CT images were obtained.  Surface markings were placed.    The CT images were loaded into the planning software.  Then the target and avoidance structures were contoured.  Treatment planning then occurred.  The radiation prescription was entered and confirmed.  A total of 3 complex treatment devices were fabricated which relate to the designed radiation treatment fields. Each of these customized fields/ complex treatment devices will be used on a daily basis during the radiation course. I have requested : 3D Simulation  I have requested a DVH of the following structures: Gross tumor volume, spinal cord, lungs.   PLAN:  The patient will receive 37.5 Gy in 15 fractions.  Special treatment procedure The patient will receive chemotherapy during the course of radiation treatment. The patient may experience increased or overlapping toxicity due to this combined-modality approach and the patient will be monitored for such problems. This may include extra lab work as necessary. This therefore constitutes a special treatment procedure.  ________________________________   Radene Gunning, MD, PhD

## 2013-08-08 NOTE — Telephone Encounter (Signed)
Per staff message and POF I have scheduled appts.  JMW  

## 2013-08-09 ENCOUNTER — Ambulatory Visit
Admission: RE | Admit: 2013-08-09 | Discharge: 2013-08-09 | Disposition: A | Discharge status: Home / Self Care | Payer: BC Managed Care – PPO | Source: Ambulatory Visit | Attending: Radiation Oncology | Admitting: Radiation Oncology

## 2013-08-12 ENCOUNTER — Encounter: Payer: Self-pay | Admitting: Internal Medicine

## 2013-08-12 ENCOUNTER — Ambulatory Visit: Payer: BC Managed Care – PPO | Admitting: Internal Medicine

## 2013-08-12 ENCOUNTER — Other Ambulatory Visit: Payer: BC Managed Care – PPO

## 2013-08-12 ENCOUNTER — Ambulatory Visit (HOSPITAL_BASED_OUTPATIENT_CLINIC_OR_DEPARTMENT_OTHER): Payer: BC Managed Care – PPO | Admitting: Internal Medicine

## 2013-08-12 ENCOUNTER — Ambulatory Visit (HOSPITAL_BASED_OUTPATIENT_CLINIC_OR_DEPARTMENT_OTHER): Payer: BC Managed Care – PPO

## 2013-08-12 ENCOUNTER — Ambulatory Visit (INDEPENDENT_AMBULATORY_CARE_PROVIDER_SITE_OTHER): Payer: BC Managed Care – PPO | Admitting: Surgery

## 2013-08-12 ENCOUNTER — Telehealth: Payer: Self-pay | Admitting: *Deleted

## 2013-08-12 ENCOUNTER — Other Ambulatory Visit (HOSPITAL_BASED_OUTPATIENT_CLINIC_OR_DEPARTMENT_OTHER): Payer: BC Managed Care – PPO

## 2013-08-12 ENCOUNTER — Telehealth: Payer: Self-pay | Admitting: Internal Medicine

## 2013-08-12 ENCOUNTER — Ambulatory Visit
Admission: RE | Admit: 2013-08-12 | Discharge: 2013-08-12 | Disposition: A | Discharge status: Home / Self Care | Payer: BC Managed Care – PPO | Source: Ambulatory Visit | Attending: Radiation Oncology | Admitting: Radiation Oncology

## 2013-08-12 ENCOUNTER — Encounter (INDEPENDENT_AMBULATORY_CARE_PROVIDER_SITE_OTHER): Payer: Self-pay | Admitting: Surgery

## 2013-08-12 VITALS — BP 129/84 | HR 81 | Temp 98.9°F | Resp 18 | Ht 70.5 in | Wt 174.5 lb

## 2013-08-12 VITALS — BP 122/70 | HR 88 | Resp 18 | Ht 70.0 in | Wt 178.8 lb

## 2013-08-12 DIAGNOSIS — L723 Sebaceous cyst: Secondary | ICD-10-CM | POA: Insufficient documentation

## 2013-08-12 DIAGNOSIS — C3490 Malignant neoplasm of unspecified part of unspecified bronchus or lung: Secondary | ICD-10-CM

## 2013-08-12 DIAGNOSIS — R229 Localized swelling, mass and lump, unspecified: Secondary | ICD-10-CM

## 2013-08-12 DIAGNOSIS — C7800 Secondary malignant neoplasm of unspecified lung: Secondary | ICD-10-CM

## 2013-08-12 DIAGNOSIS — C3400 Malignant neoplasm of unspecified main bronchus: Secondary | ICD-10-CM

## 2013-08-12 DIAGNOSIS — C3491 Malignant neoplasm of unspecified part of right bronchus or lung: Secondary | ICD-10-CM

## 2013-08-12 DIAGNOSIS — R222 Localized swelling, mass and lump, trunk: Secondary | ICD-10-CM

## 2013-08-12 DIAGNOSIS — Z5111 Encounter for antineoplastic chemotherapy: Secondary | ICD-10-CM

## 2013-08-12 LAB — CBC WITH DIFFERENTIAL/PLATELET
Basophils Absolute: 0 10*3/uL (ref 0.0–0.1)
EOS%: 2 % (ref 0.0–7.0)
HGB: 15.8 g/dL (ref 13.0–17.1)
MCH: 31.4 pg (ref 27.2–33.4)
MCV: 92 fL (ref 79.3–98.0)
MONO%: 7.9 % (ref 0.0–14.0)
RBC: 5.03 10*6/uL (ref 4.20–5.82)
RDW: 12.2 % (ref 11.0–14.6)

## 2013-08-12 LAB — COMPREHENSIVE METABOLIC PANEL (CC13)
Alkaline Phosphatase: 72 U/L (ref 40–150)
BUN: 18.2 mg/dL (ref 7.0–26.0)
Glucose: 80 mg/dl (ref 70–140)
Sodium: 140 mEq/L (ref 136–145)
Total Bilirubin: 0.39 mg/dL (ref 0.20–1.20)
Total Protein: 7.9 g/dL (ref 6.4–8.3)

## 2013-08-12 MED ORDER — SODIUM CHLORIDE 0.9 % IV SOLN
120.0000 mg/m2 | Freq: Once | INTRAVENOUS | Status: AC
  Administered 2013-08-12: 240 mg via INTRAVENOUS
  Filled 2013-08-12: qty 12

## 2013-08-12 MED ORDER — SODIUM CHLORIDE 0.9 % IV SOLN
Freq: Once | INTRAVENOUS | Status: AC
  Administered 2013-08-12: 11:00:00 via INTRAVENOUS

## 2013-08-12 MED ORDER — DEXAMETHASONE SODIUM PHOSPHATE 20 MG/5ML IJ SOLN
20.0000 mg | Freq: Once | INTRAMUSCULAR | Status: AC
  Administered 2013-08-12: 20 mg via INTRAVENOUS

## 2013-08-12 MED ORDER — SODIUM CHLORIDE 0.9 % IV SOLN
737.0000 mg | Freq: Once | INTRAVENOUS | Status: AC
  Administered 2013-08-12: 740 mg via INTRAVENOUS
  Filled 2013-08-12: qty 74

## 2013-08-12 MED ORDER — ONDANSETRON 16 MG/50ML IVPB (CHCC)
16.0000 mg | Freq: Once | INTRAVENOUS | Status: AC
Start: 2013-08-12 — End: 2013-08-12
  Administered 2013-08-12: 16 mg via INTRAVENOUS

## 2013-08-12 NOTE — Telephone Encounter (Signed)
GV AND PRINTED APPT SCHED AND AVS FOR PT...MW ADDED TX

## 2013-08-12 NOTE — Patient Instructions (Addendum)
Consider return to the office to have the back mass (probable sebaceous/epidermal cyst) removed  Epidermal Cyst An epidermal cyst is sometimes called a sebaceous cyst, epidermal inclusion cyst, or infundibular cyst. These cysts usually contain a substance that looks "pasty" or "cheesy" and may have a bad smell. This substance is a protein called keratin. Epidermal cysts are usually found on the face, neck, or trunk. They may also occur in the vaginal area or other parts of the genitalia of both men and women. Epidermal cysts are usually small, painless, slow-growing bumps or lumps that move freely under the skin. It is important not to try to pop them. This may cause an infection and lead to tenderness and swelling. CAUSES  Epidermal cysts may be caused by a deep penetrating injury to the skin or a plugged hair follicle, often associated with acne. SYMPTOMS  Epidermal cysts can become inflamed and cause:  Redness.  Tenderness.  Increased temperature of the skin over the bumps or lumps.  Grayish-white, bad smelling material that drains from the bump or lump. DIAGNOSIS  Epidermal cysts are easily diagnosed by your caregiver during an exam. Rarely, a tissue sample (biopsy) may be taken to rule out other conditions that may resemble epidermal cysts. TREATMENT   Epidermal cysts often get better and disappear on their own. They are rarely ever cancerous.  If a cyst becomes infected, it may become inflamed and tender. This may require opening and draining the cyst. Treatment with antibiotics may be necessary. When the infection is gone, the cyst may be removed with minor surgery.  Small, inflamed cysts can often be treated with antibiotics or by injecting steroid medicines.  Sometimes, epidermal cysts become large and bothersome. If this happens, surgical removal in your caregiver's office may be necessary. HOME CARE INSTRUCTIONS  Only take over-the-counter or prescription medicines as directed  by your caregiver.  Take your antibiotics as directed. Finish them even if you start to feel better. SEEK MEDICAL CARE IF:   Your cyst becomes tender, red, or swollen.  Your condition is not improving or is getting worse.  You have any other questions or concerns. MAKE SURE YOU:  Understand these instructions.  Will watch your condition.  Will get help right away if you are not doing well or get worse. Document Released: 11/05/2004 Document Revised: 02/27/2012 Document Reviewed: 06/13/2011 [facility] Patient Information 2014 [place], [place].

## 2013-08-12 NOTE — Telephone Encounter (Signed)
Per staff message and POF I have scheduled appts.  JMW  

## 2013-08-12 NOTE — Progress Notes (Signed)
Enrolled pt in the [employer] program.  I faxed signed form and activated card today.  °

## 2013-08-12 NOTE — Progress Notes (Signed)
[facility] [phone]   [phone]  OFFICE PROGRESS NOTE  [name], MD [phone] [address] [facility] [address] [place] [address]  DIAGNOSIS: Extensive stage small cell lung cancer diagnosed in August of 2014  PRIOR THERAPY: Status post palliative radiotherapy to the mediastinal lymphadenopathy under the care of Dr. Mitzi Hansen  CURRENT THERAPY: Systemic chemotherapy with carboplatin for AUC of 5 on day 1 and etoposide at 120 mg/M2 on days 1, 2 and 3 with Neulasta support on day 4. First cycle and 08/12/2013.  CHEMOTHERAPY INTENT: Palliative  CURRENT # OF CHEMOTHERAPY CYCLES: 1  CURRENT ANTIEMETICS: Zofran, dexamethasone and Compazine  CURRENT SMOKING STATUS: Quit smoking  ORAL CHEMOTHERAPY AND CONSENT: None  CURRENT BISPHOSPHONATES USE: None  PAIN MANAGEMENT: 0/10  NARCOTICS INDUCED CONSTIPATION: None  LIVING WILL AND CODE STATUS: Full code   INTERVAL HISTORY: Ray Burns 52 y.o. male returns to the clinic today for followup visit accompanied by his wife. The patient is feeling a little bit better today with less dysphagia and pain in the central chest. He denied having any significant cough, hemoptysis or shortness of breath. He denied having any recent weight loss. He tolerated his palliative radiotherapy to the mediastinal mass fairly well. He is scheduled to see Dr. Michaell Cowing for evaluation of the suspicious cyst on his upper back later today. The patient is here today to start the first cycle of his systemic chemotherapy with carboplatin and etoposide. He had a PET scan as well as MRI of his brain performed recently.  MEDICAL HISTORY: Past Medical History  Diagnosis Date  . GERD (gastroesophageal reflux disease)     mild  . Non-small cell lung cancer     ALLERGIES:  has No Known Allergies.  MEDICATIONS:  Current Outpatient Prescriptions  Medication Sig Dispense Refill  . acetaminophen (TYLENOL) 500 MG tablet Take 500 mg by  mouth every 6 (six) hours as needed for pain.      Marland Kitchen omeprazole (PRILOSEC OTC) 20 MG tablet Take 20 mg by mouth daily.      . prochlorperazine (COMPAZINE) 10 MG tablet Take 1 tablet (10 mg total) by mouth every 6 (six) hours as needed.  30 tablet  0   No current facility-administered medications for this visit.    SURGICAL HISTORY:  Past Surgical History  Procedure Laterality Date  . Appendectomy      52 y/o  . Video bronchoscopy Bilateral 07/25/2013    Procedure: VIDEO BRONCHOSCOPY WITHOUT FLUORO;  Surgeon: [name], MD;  Location: [facility];  Service: Cardiopulmonary;  Laterality: Bilateral;    REVIEW OF SYSTEMS:  A comprehensive review of systems was negative except for: Constitutional: positive for fatigue   PHYSICAL EXAMINATION: General appearance: alert, cooperative, fatigued and no distress Head: Normocephalic, without obvious abnormality, atraumatic Neck: no adenopathy Lymph nodes: Cervical, supraclavicular, and axillary nodes normal. Resp: clear to auscultation bilaterally Cardio: regular rate and rhythm, S1, S2 normal, no murmur, click, rub or gallop GI: soft, non-tender; bowel sounds normal; no masses,  no organomegaly Extremities: extremities normal, atraumatic, no cyanosis or edema Neurologic: Alert and oriented X 3, normal strength and tone. Normal symmetric reflexes. Normal coordination and gait  ECOG PERFORMANCE STATUS: 1 - Symptomatic but completely ambulatory  There were no vitals taken for this visit.  LABORATORY DATA: Lab Results  Component Value Date   WBC 6.9 08/12/2013   HGB 15.8 08/12/2013   HCT 46.3 08/12/2013   MCV 92.0 08/12/2013  PLT 204 08/12/2013      Chemistry      Component Value Date/Time   NA 136 07/09/2013 1446   K 4.6 07/09/2013 1446   CL 102 07/09/2013 1446   CO2 27 07/09/2013 1446   BUN 17 07/09/2013 1446   CREATININE 1.0 07/09/2013 1446      Component Value Date/Time   CALCIUM 9.8 07/09/2013 1446   ALKPHOS 62 07/09/2013 1446    AST 24 07/09/2013 1446   ALT 22 07/09/2013 1446   BILITOT 0.8 07/09/2013 1446       RADIOGRAPHIC STUDIES: Dg Eye Foreign Body  08/07/2013   CLINICAL DATA:  Metal working/exposure; clearance prior to MRI  EXAM: ORBITS FOR FOREIGN BODY - 2 VIEW  COMPARISON:  Nuclear medicine PET-CT from 08/07/2013  FINDINGS: There is no evidence of metallic foreign body within the orbits. No significant bone abnormality identified.  IMPRESSION: No evidence of metallic foreign body within the orbits.   Electronically Signed   By: Herbie Baltimore   On: 08/07/2013 17:28   Mr Brain W Wo Contrast  08/07/2013   *RADIOLOGY REPORT*  Clinical Data: Small cell lung cancer.  Staging.  MRI HEAD WITHOUT AND WITH CONTRAST  Technique:  Multiplanar, multiecho pulse sequences of the brain and surrounding structures were obtained according to standard protocol without and with intravenous contrast  Contrast: 15mL MULTIHANCE GADOBENATE DIMEGLUMINE 529 MG/ML IV SOLN  Comparison: None.  Findings: Diffusion imaging does not show any acute or subacute infarction.  The brainstem and cerebellum are normal.  The cerebral hemispheres show mild to moderate chronic small vessel changes of the deep subcortical white matter, advanced for age.  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 After contrast administration, there is no abnormal enhancement. No evidence of primary or metastatic mass.  No hydrocephalus or extra-axial collection.  No pituitary abnormality.  No skull or skull base lesion.  IMPRESSION: No evidence of metastatic disease.  Age advanced ischemic changes affecting the brain.   Original Report Authenticated By: Paulina Fusi, M.D.   Nm Pet Image Initial (pi) Skull Base To Thigh  08/07/2013   *RADIOLOGY REPORT*  Clinical Data:  Initial treatment strategy for small cell lung cancer.  NUCLEAR MEDICINE PET WHOLE BODY  Fasting Blood Glucose:  86  Technique:  17.7 mCi F-18 FDG was injected intravenously. CT  data was obtained and used for attenuation correction and anatomic localization only.  (This was not acquired as a diagnostic CT examination.) Additional exam technical data entered on technologist worksheet.  Comparison:  CT chest from 07/12/2013  Findings:  Head/Neck:   No hypermetabolic lymph nodes in the neck.  Chest:  The patients known of bulky thoracic disease is markedly hypermetabolic.  The large subcarinal lymph node has SUV max = 17. Multiple bilateral pulmonary metastases are identified.  Index 11 x 17 mm lingular nodule has SUV max = 8.  Hypermetabolic lymphadenopathy is seen in the hila bilaterally, right greater than left.  The 2.2 cm low density subcutaneous lesion in the left upper paramidline back is hypermetabolic.  Abdomen/Pelvis:  3.3 cm right adrenal mass is markedly hypermetabolic with SUV max = 14.  The patient has a large gastrohepatic ligament lymph node which is markedly hypermetabolic.  No definite evidence for hypermetabolic metastases to the liver. There is a soft tissue nodule anterior to the spleen which is not hypermetabolic and a wall are present a tiny splenule.  Skeleton:  No focal hypermetabolic activity to suggest  skeletal metastasis.  Extremities:  A small hypermetabolic lesion is identified in the right to triceps musculature.  IMPRESSION: Markedly hypermetabolic mediastinal and hilar lymphadenopathy in the chest associated with bilateral hypermetabolic pulmonary metastases.  Right adrenal metastatic lesion with hypermetabolic metastatic disease to the gastrohepatic ligament.  Hypermetabolic lesion in the right triceps musculature concerning for metastatic involvement.  A water density lesion in the subcutaneous fat of the upper left paramidline back to may represent a sebaceous cyst showing increased F D G uptake secondary to superinfection or inflammation, but necrotic metastatic lesion to this location is suspected.   Original Report Authenticated By: Kennith Center, M.D.     ASSESSMENT AND PLAN: This is a very pleasant 52 years old white male recently diagnosed with extensive stage small cell lung cancer status post short course of palliative radiotherapy to the mediastinum for relief of dysphagia and shortness of breath. The patient is here today to start the first cycle of his systemic chemotherapy with carboplatin and etoposide. I discussed with the patient and his wife the recent imaging studies including the PET scan and MRI of the brain and showed them the images. I recommended for the patient to proceed with his systemic chemotherapy with carboplatin and etoposide today as scheduled. I reminded the patient the adverse effect of this treatment including but not limited to alopecia, myelosuppression, nausea and vomiting, peripheral neuropathy, liver or renal dysfunction. The patient would like to proceed with treatment as planned. He'll see Dr. Michaell Cowing later today for evaluation of the sebaceous cyst on the back. The patient would come back for followup visit for evaluation and management any adverse effect of his chemotherapy. He was advised to call immediately if he has any concerning symptoms in the interval.  The patient voices understanding of current disease status and treatment options and is in agreement with the current care plan.  All questions were answered. The patient knows to call the clinic with any problems, questions or concerns. We can certainly see the patient much sooner if necessary.  I spent 15 minutes counseling the patient face to face. The total time spent in the appointment was 25 minutes.

## 2013-08-12 NOTE — Progress Notes (Signed)
Subjective:     Patient ID: Authur Cubit, male   DOB: [birth year], 52 y.o.   MRN: [id number]  HPI  Armin Yerger  06/28/1961 [id number]  Patient Care Team: Wanda Plump, MD as PCP - General (Internal Medicine) Si Gaul, MD as Consulting Physician (Oncology) [name], MD as Consulting Physician (Pulmonary Disease) Jonna Coup, MD as Consulting Physician (Radiation Oncology)  This patient is a 52 y.o.male who presents today for surgical evaluation at the request of Dr. Arbutus Ped.   Reason for visit: Worsening back mass.  Probable chronically infected sebaceous cyst  Pleasant male.  He comes today with his wife.  Recently diagnosed with lung cancer.  About to start radiation and chemotherapy.  Has had a painful nodule over his left shoulder blade.  We will get swollen and tender.  They have been able to express cheesy material out of it.  Calm down and then recurs.  Based on concerns, his medical oncologist wished this to be addressed by surgery to avoid a more severe infection.  Patient still few other subcutaneous nodules on his back but nothing severe.  No prior history of boils or lancets.  No history of neurofibromatosis.  He had stopped smoking 4 years ago but was still dippin.'.  Patient Active Problem List   Diagnosis Date Noted  . Mass on back, prob seb cyst 08/12/2013  . Small cell lung cancer 08/01/2013  . Lung mass 07/19/2013  . Dysphagia, unspecified(787.20) 07/09/2013  . Abnormal CXR 07/09/2013    Past Medical History  Diagnosis Date  . GERD (gastroesophageal reflux disease)     mild  . Non-small cell lung cancer     Past Surgical History  Procedure Laterality Date  . Appendectomy      52 y/o  . Video bronchoscopy Bilateral 07/25/2013    Procedure: VIDEO BRONCHOSCOPY WITHOUT FLUORO;  Surgeon: [name], MD;  Location: [facility];  Service: Cardiopulmonary;  Laterality: Bilateral;    History   Social History  . Marital Status: Married    Spouse  Name: N/A    Number of Children: 3  . Years of Education: N/A   Occupational History  . carpenter     Social History Main Topics  . Smoking status: Former Smoker -- 1.00 packs/day for 30 years    Types: Cigarettes    Quit date: 12/19/2008  . Smokeless tobacco: Former Neurosurgeon    Types: Chew    Quit date: 08/11/2013  . Alcohol Use: Yes     Comment: 3 times a week, 6 beers  . Drug Use: No  . Sexual Activity: Not on file   Other Topics Concern  . Not on file   Social History Narrative   Married     Family History  Problem Relation Age of Onset  . CAD Father   . Lung cancer Father     smoker  . Cancer Father     lung  . Stroke Neg Hx   . Prostate cancer Neg Hx   . Diabetes Mother   . Colon cancer Sister   . Cancer Sister     colon    Current Outpatient Prescriptions  Medication Sig Dispense Refill  . acetaminophen (TYLENOL) 500 MG tablet Take 500 mg by mouth every 6 (six) hours as needed for pain.      Marland Kitchen omeprazole (PRILOSEC OTC) 20 MG tablet Take 20 mg by mouth daily.      . prochlorperazine (COMPAZINE) 10 MG tablet Take 1  tablet (10 mg total) by mouth every 6 (six) hours as needed.  30 tablet  0   No current facility-administered medications for this visit.     No Known Allergies  BP 122/70  Pulse 88  Resp 18  Ht 5\' 10"  (1.778 m)  Wt 178 lb 12.8 oz (81.103 kg)  BMI 25.66 kg/m2  Dg Eye Foreign Body  08/07/2013   CLINICAL DATA:  Metal working/exposure; clearance prior to MRI  EXAM: ORBITS FOR FOREIGN BODY - 2 VIEW  COMPARISON:  Nuclear medicine PET-CT from 08/07/2013  FINDINGS: There is no evidence of metallic foreign body within the orbits. No significant bone abnormality identified.  IMPRESSION: No evidence of metallic foreign body within the orbits.   Electronically Signed   By: Herbie Baltimore   On: 08/07/2013 17:28   Mr Brain W Wo Contrast  08/07/2013   *RADIOLOGY REPORT*  Clinical Data: Small cell lung cancer.  Staging.  MRI HEAD WITHOUT AND WITH CONTRAST   Technique:  Multiplanar, multiecho pulse sequences of the brain and surrounding structures were obtained according to standard protocol without and with intravenous contrast  Contrast: 15mL MULTIHANCE GADOBENATE DIMEGLUMINE 529 MG/ML IV SOLN  Comparison: None.  Findings: Diffusion imaging does not show any acute or subacute infarction.  The brainstem and cerebellum are normal.  The cerebral hemispheres show mild to moderate chronic small vessel changes of the deep subcortical white matter, advanced for age.  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 After contrast administration, there is no abnormal enhancement. No evidence of primary or metastatic mass.  No hydrocephalus or extra-axial collection.  No pituitary abnormality.  No skull or skull base lesion.  IMPRESSION: No evidence of metastatic disease.  Age advanced ischemic changes affecting the brain.   Original Report Authenticated By: Paulina Fusi, M.D.   Nm Pet Image Initial (pi) Skull Base To Thigh  08/07/2013   *RADIOLOGY REPORT*  Clinical Data:  Initial treatment strategy for small cell lung cancer.  NUCLEAR MEDICINE PET WHOLE BODY  Fasting Blood Glucose:  86  Technique:  17.7 mCi F-18 FDG was injected intravenously. CT data was obtained and used for attenuation correction and anatomic localization only.  (This was not acquired as a diagnostic CT examination.) Additional exam technical data entered on technologist worksheet.  Comparison:  CT chest from 07/12/2013  Findings:  Head/Neck:   No hypermetabolic lymph nodes in the neck.  Chest:  The patients known of bulky thoracic disease is markedly hypermetabolic.  The large subcarinal lymph node has SUV max = 17. Multiple bilateral pulmonary metastases are identified.  Index 11 x 17 mm lingular nodule has SUV max = 8.  Hypermetabolic lymphadenopathy is seen in the hila bilaterally, right greater than left.  The 2.2 cm low density subcutaneous lesion in the left upper  paramidline back is hypermetabolic.  Abdomen/Pelvis:  3.3 cm right adrenal mass is markedly hypermetabolic with SUV max = 14.  The patient has a large gastrohepatic ligament lymph node which is markedly hypermetabolic.  No definite evidence for hypermetabolic metastases to the liver. There is a soft tissue nodule anterior to the spleen which is not hypermetabolic and a wall are present a tiny splenule.  Skeleton:  No focal hypermetabolic activity to suggest skeletal metastasis.  Extremities:  A small hypermetabolic lesion is identified in the right to triceps musculature.  IMPRESSION: Markedly hypermetabolic mediastinal and hilar lymphadenopathy in the chest associated with bilateral hypermetabolic pulmonary metastases.  Right adrenal metastatic  lesion with hypermetabolic metastatic disease to the gastrohepatic ligament.  Hypermetabolic lesion in the right triceps musculature concerning for metastatic involvement.  A water density lesion in the subcutaneous fat of the upper left paramidline back to may represent a sebaceous cyst showing increased F D G uptake secondary to superinfection or inflammation, but necrotic metastatic lesion to this location is suspected.   Original Report Authenticated By: Kennith Center, M.D.     Review of Systems  Constitutional: Positive for fatigue and unexpected weight change. Negative for fever, chills and diaphoresis.  HENT: Negative for nosebleeds, sore throat, facial swelling, mouth sores, trouble swallowing and ear discharge.   Eyes: Negative for photophobia, discharge and visual disturbance.  Respiratory: Positive for shortness of breath. Negative for choking, chest tightness and stridor.   Cardiovascular: Positive for chest pain. Negative for palpitations.  Gastrointestinal: Negative for nausea, vomiting, abdominal pain, diarrhea, constipation, blood in stool, abdominal distention, anal bleeding and rectal pain.  Endocrine: Negative for cold intolerance and heat  intolerance.  Genitourinary: Negative for dysuria, urgency, difficulty urinating and testicular pain.  Musculoskeletal: Positive for back pain. Negative for myalgias, arthralgias and gait problem.  Skin: Positive for wound. Negative for color change, pallor and rash.  Allergic/Immunologic: Negative for environmental allergies and food allergies.  Neurological: Negative for dizziness, speech difficulty, weakness, numbness and headaches.  Hematological: Negative for adenopathy. Does not bruise/bleed easily.  Psychiatric/Behavioral: Negative for hallucinations, confusion and agitation.       Objective:   Physical Exam  Constitutional: He is oriented to person, place, and time. He appears well-developed and well-nourished. No distress.  HENT:  Head: Normocephalic.  Mouth/Throat: Oropharynx is clear and moist. No oropharyngeal exudate.  Eyes: Conjunctivae and EOM are normal. Pupils are equal, round, and reactive to light. No scleral icterus.  Neck: Normal range of motion. Neck supple. No tracheal deviation present.  Cardiovascular: Normal rate, regular rhythm and intact distal pulses.   Pulmonary/Chest: Breath sounds normal. No respiratory distress. He has no wheezes. He has no rales. He exhibits no tenderness.  Abdominal: Soft. He exhibits no distension. There is no tenderness. Hernia confirmed negative in the right inguinal area and confirmed negative in the left inguinal area.  Musculoskeletal: Normal range of motion. He exhibits no tenderness.       Arms: Lymphadenopathy:    He has no cervical adenopathy.       Right: No inguinal adenopathy present.       Left: No inguinal adenopathy present.  Neurological: He is alert and oriented to person, place, and time. No cranial nerve deficit. He exhibits normal muscle tone. Coordination normal.  Skin: Skin is warm and dry. No rash noted. He is not diaphoretic. No erythema. No pallor.  Psychiatric: He has a normal mood and affect. His behavior is  normal. Judgment and thought content normal.       Assessment:     Site subcutaneous mass consistent with sebaceous cyst with recurrent swelling.     Plan:     I think he would benefit from removal.  With his chemotherapy, he is at increased risk of infection.  However, chemotherapy will slow down his wound healing and make an open wound more possible.  He wished to try and get this done.  I think it is small enough to be resected under local anesthetic in our office soon.  I discussed this with the patient at his wife:  The pathophysiology of skin & subcutaneous masses was discussed.  Natural history risks without  surgery were discussed.  I recommended surgery to remove the mass.  I explained the technique of removal with use of local anesthesia & possible need for more aggressive sedation/anesthesia for patient comfort.    Risks such as bleeding, infection, heart attack, death, and other risks were discussed.  I noted a good likelihood this will help address the problem.   Possibility that this will not correct all symptoms was explained. Possibility of regrowth/recurrence of the mass was discussed.  We will work to minimize complications. Questions were answered.  The patient expresses understanding & wishes to proceed with surgery.

## 2013-08-12 NOTE — Patient Instructions (Signed)
CURRENT THERAPY: Systemic chemotherapy with carboplatin for AUC of 5 on day 1 and etoposide at 120 mg/M2 on days 1, 2 and 3 with Neulasta support on day 4. First cycle and 08/12/2013.  CHEMOTHERAPY INTENT: Palliative  CURRENT # OF CHEMOTHERAPY CYCLES: 1  CURRENT ANTIEMETICS: Zofran, dexamethasone and Compazine  CURRENT SMOKING STATUS: Quit smoking  ORAL CHEMOTHERAPY AND CONSENT: None  CURRENT BISPHOSPHONATES USE: None  PAIN MANAGEMENT: 0/10  NARCOTICS INDUCED CONSTIPATION: None  LIVING WILL AND CODE STATUS: Full code

## 2013-08-12 NOTE — Patient Instructions (Addendum)
[facility]cer Center Discharge Instructions for Patients Receiving Chemotherapy  Today you received the following chemotherapy agents  Etoposide, Carboplatin  To help prevent nausea and vomiting after your treatment, we encourage you to take your nausea medication: Compazine (Prochlorperazine)   If you develop nausea and vomiting that is not controlled by your nausea medication, call the clinic.   BELOW ARE SYMPTOMS THAT SHOULD BE REPORTED IMMEDIATELY:  *FEVER GREATER THAN 100.5 F  *CHILLS WITH OR WITHOUT FEVER  NAUSEA AND VOMITING THAT IS NOT CONTROLLED WITH YOUR NAUSEA MEDICATION  *UNUSUAL SHORTNESS OF BREATH  *UNUSUAL BRUISING OR BLEEDING  TENDERNESS IN MOUTH AND THROAT WITH OR WITHOUT PRESENCE OF ULCERS  *URINARY PROBLEMS  *BOWEL PROBLEMS  UNUSUAL RASH Items with * indicate a potential emergency and should be followed up as soon as possible.  Feel free to call the clinic you have any questions or concerns. The clinic phone number is [phone].  Etoposide, VP-16 injection What is this medicine? ETOPOSIDE, VP-16 (e toe POE side) is a chemotherapy drug. It is used to treat testicular cancer, lung cancer, and other cancers. This medicine may be used for other purposes; ask your health care provider or pharmacist if you have questions. What should I tell my health care provider before I take this medicine? They need to know if you have any of these conditions: -infection -kidney disease -low blood counts, like low white cell, platelet, or red cell counts -an unusual or allergic reaction to etoposide, other chemotherapeutic agents, other medicines, foods, dyes, or preservatives -pregnant or trying to get pregnant -breast-feeding How should I use this medicine? This medicine is for infusion into a vein. It is administered in a hospital or clinic by a specially trained health care professional. Talk to your pediatrician regarding the use of this medicine in  children. Special care may be needed. Overdosage: If you think you have taken too much of this medicine contact a poison control center or emergency room at once. NOTE: This medicine is only for you. Do not share this medicine with others. What if I miss a dose? It is important not to miss your dose. Call your doctor or health care professional if you are unable to keep an appointment. What may interact with this medicine? -cyclosporine -medicines to increase blood counts like filgrastim, pegfilgrastim, sargramostim -vaccines This list may not describe all possible interactions. Give your health care provider a list of all the medicines, herbs, non-prescription drugs, or dietary supplements you use. Also tell them if you smoke, drink alcohol, or use illegal drugs. Some items may interact with your medicine. What should I watch for while using this medicine? Visit your doctor for checks on your progress. This drug may make you feel generally unwell. This is not uncommon, as chemotherapy can affect healthy cells as well as cancer cells. Report any side effects. Continue your course of treatment even though you feel ill unless your doctor tells you to stop. In some cases, you may be given additional medicines to help with side effects. Follow all directions for their use. Call your doctor or health care professional for advice if you get a fever, chills or sore throat, or other symptoms of a cold or flu. Do not treat yourself. This drug decreases your body's ability to fight infections. Try to avoid being around people who are sick. This medicine may increase your risk to bruise or bleed. Call your doctor or health care professional if you notice any unusual bleeding. Be  careful brushing and flossing your teeth or using a toothpick because you may get an infection or bleed more easily. If you have any dental work done, tell your dentist you are receiving this medicine. Avoid taking products that contain  aspirin, acetaminophen, ibuprofen, naproxen, or ketoprofen unless instructed by your doctor. These medicines may hide a fever. Do not become pregnant while taking this medicine. Women should inform their doctor if they wish to become pregnant or think they might be pregnant. There is a potential for serious side effects to an unborn child. Talk to your health care professional or pharmacist for more information. Do not breast-feed an infant while taking this medicine. What side effects may I notice from receiving this medicine? Side effects that you should report to your doctor or health care professional as soon as possible: -allergic reactions like skin rash, itching or hives, swelling of the face, lips, or tongue -low blood counts - this medicine may decrease the number of white blood cells, red blood cells and platelets. You may be at increased risk for infections and bleeding. -signs of infection - fever or chills, cough, sore throat, pain or difficulty passing urine -signs of decreased platelets or bleeding - bruising, pinpoint red spots on the skin, black, tarry stools, blood in the urine -signs of decreased red blood cells - unusually weak or tired, fainting spells, lightheadedness -breathing problems -changes in vision -mouth or throat sores or ulcers -pain, redness, swelling or irritation at the injection site -pain, tingling, numbness in the hands or feet -redness, blistering, peeling or loosening of the skin, including inside the mouth -seizures -vomiting Side effects that usually do not require medical attention (report to your doctor or health care professional if they continue or are bothersome): -diarrhea -hair loss -loss of appetite -nausea -stomach pain This list may not describe all possible side effects. Call your doctor for medical advice about side effects. You may report side effects to FDA at 1-800-FDA-1088. Where should I keep my medicine? This drug is given in a  hospital or clinic and will not be stored at home. NOTE: This sheet is a summary. It may not cover all possible information. If you have questions about this medicine, talk to your doctor, pharmacist, or health care provider.  2012, [employer]. (04/07/2008 5:24:12 PM)   Carboplatin injection What is this medicine? CARBOPLATIN (KAR boe pla tin) is a chemotherapy drug. It targets fast dividing cells, like cancer cells, and causes these cells to die. This medicine is used to treat ovarian cancer and many other cancers. This medicine may be used for other purposes; ask your health care provider or pharmacist if you have questions. What should I tell my health care provider before I take this medicine? They need to know if you have any of these conditions: -blood disorders -hearing problems -kidney disease -recent or ongoing radiation therapy -an unusual or allergic reaction to carboplatin, cisplatin, other chemotherapy, other medicines, foods, dyes, or preservatives -pregnant or trying to get pregnant -breast-feeding How should I use this medicine? This drug is usually given as an infusion into a vein. It is administered in a hospital or clinic by a specially trained health care professional. Talk to your pediatrician regarding the use of this medicine in children. Special care may be needed. Overdosage: If you think you have taken too much of this medicine contact a poison control center or emergency room at once. NOTE: This medicine is only for you. Do not share this medicine with  others. What if I miss a dose? It is important not to miss a dose. Call your doctor or health care professional if you are unable to keep an appointment. What may interact with this medicine? -medicines for seizures -medicines to increase blood counts like filgrastim, pegfilgrastim, sargramostim -some antibiotics like amikacin, gentamicin, neomycin, streptomycin, tobramycin -vaccines Talk to your  doctor or health care professional before taking any of these medicines: -acetaminophen -aspirin -ibuprofen -ketoprofen -naproxen This list may not describe all possible interactions. Give your health care provider a list of all the medicines, herbs, non-prescription drugs, or dietary supplements you use. Also tell them if you smoke, drink alcohol, or use illegal drugs. Some items may interact with your medicine. What should I watch for while using this medicine? Your condition will be monitored carefully while you are receiving this medicine. You will need important blood work done while you are taking this medicine. This drug may make you feel generally unwell. This is not uncommon, as chemotherapy can affect healthy cells as well as cancer cells. Report any side effects. Continue your course of treatment even though you feel ill unless your doctor tells you to stop. In some cases, you may be given additional medicines to help with side effects. Follow all directions for their use. Call your doctor or health care professional for advice if you get a fever, chills or sore throat, or other symptoms of a cold or flu. Do not treat yourself. This drug decreases your body's ability to fight infections. Try to avoid being around people who are sick. This medicine may increase your risk to bruise or bleed. Call your doctor or health care professional if you notice any unusual bleeding. Be careful brushing and flossing your teeth or using a toothpick because you may get an infection or bleed more easily. If you have any dental work done, tell your dentist you are receiving this medicine. Avoid taking products that contain aspirin, acetaminophen, ibuprofen, naproxen, or ketoprofen unless instructed by your doctor. These medicines may hide a fever. Do not become pregnant while taking this medicine. Women should inform their doctor if they wish to become pregnant or think they might be pregnant. There is a  potential for serious side effects to an unborn child. Talk to your health care professional or pharmacist for more information. Do not breast-feed an infant while taking this medicine. What side effects may I notice from receiving this medicine? Side effects that you should report to your doctor or health care professional as soon as possible: -allergic reactions like skin rash, itching or hives, swelling of the face, lips, or tongue -signs of infection - fever or chills, cough, sore throat, pain or difficulty passing urine -signs of decreased platelets or bleeding - bruising, pinpoint red spots on the skin, black, tarry stools, nosebleeds -signs of decreased red blood cells - unusually weak or tired, fainting spells, lightheadedness -breathing problems -changes in hearing -changes in vision -chest pain -high blood pressure -low blood counts - This drug may decrease the number of white blood cells, red blood cells and platelets. You may be at increased risk for infections and bleeding. -nausea and vomiting -pain, swelling, redness or irritation at the injection site -pain, tingling, numbness in the hands or feet -problems with balance, talking, walking -trouble passing urine or change in the amount of urine Side effects that usually do not require medical attention (report to your doctor or health care professional if they continue or are bothersome): -hair loss -loss  of appetite -metallic taste in the mouth or changes in taste This list may not describe all possible side effects. Call your doctor for medical advice about side effects. You may report side effects to FDA at 1-800-FDA-1088. Where should I keep my medicine? This drug is given in a hospital or clinic and will not be stored at home. NOTE: This sheet is a summary. It may not cover all possible information. If you have questions about this medicine, talk to your doctor, pharmacist, or health care provider.  2012, [employer]  Standard. (03/11/2008 2:38:05 PM)  Prochlorperazine tablets What is this medicine? PROCHLORPERAZINE (proe klor PER a zeen) helps to control severe nausea and vomiting. This medicine is also used to treat schizophrenia. It can also help patients who experience anxiety that is not due to psychological illness. This medicine may be used for other purposes; ask your health care provider or pharmacist if you have questions. What should I tell my health care provider before I take this medicine? They need to know if you have any of these conditions: -blood disorders or disease -dementia -liver disease or jaundice -Parkinson's disease -uncontrollable movement disorder -an unusual or allergic reaction to prochlorperazine, other medicines, foods, dyes, or preservatives -pregnant or trying to get pregnant -breast-feeding How should I use this medicine? Take this medicine by mouth with a glass of water. Follow the directions on the prescription label. Take your doses at regular intervals. Do not take your medicine more often than directed. Do not stop taking this medicine suddenly. This can cause nausea, vomiting, and dizziness. Ask your doctor or health care professional for advice. Talk to your pediatrician regarding the use of this medicine in children. Special care may be needed. While this drug may be prescribed for children as young as 2 years for selected conditions, precautions do apply. Overdosage: If you think you have taken too much of this medicine contact a poison control center or emergency room at once. NOTE: This medicine is only for you. Do not share this medicine with others. What if I miss a dose? If you miss a dose, take it as soon as you can. If it is almost time for your next dose, take only that dose. Do not take double or extra doses. What may interact with this medicine? Do not take this medicine with any of the following medications: -amoxapine -antidepressants like  citalopram, escitalopram, fluoxetine, paroxetine, and sertraline -deferoxamine -dofetilide -maprotiline -tricyclic antidepressants like amitriptyline, clomipramine, imipramine, nortiptyline and others This medicine may also interact with the following medications: -lithium -medicines for pain -phenytoin -propranolol -warfarin This list may not describe all possible interactions. Give your health care provider a list of all the medicines, herbs, non-prescription drugs, or dietary supplements you use. Also tell them if you smoke, drink alcohol, or use illegal drugs. Some items may interact with your medicine. What should I watch for while using this medicine? Visit your doctor or health care professional for regular checks on your progress. You may get drowsy or dizzy. Do not drive, use machinery, or do anything that needs mental alertness until you know how this medicine affects you. Do not stand or sit up quickly, especially if you are an older patient. This reduces the risk of dizzy or fainting spells. Alcohol may interfere with the effect of this medicine. Avoid alcoholic drinks. This medicine can reduce the response of your body to heat or cold. Try not to get overheated. Avoid temperature extremes, such as saunas, hot tubs, or very  hot or cold baths or showers. Dress warmly in cold weather. This medicine can make you more sensitive to the sun. Keep out of the sun. If you cannot avoid being in the sun, wear protective clothing and use sunscreen. Do not use sun lamps or tanning beds/booths. Your mouth may get dry. Chewing sugarless gum or sucking hard candy, and drinking plenty of water may help. Contact your doctor if the problem does not go away or is severe. What side effects may I notice from receiving this medicine? Side effects that you should report to your doctor or health care professional as soon as possible: -blurred vision -breast enlargement in men or women -breast milk in women  who are not breast-feeding -chest pain, fast or irregular heartbeat -confusion, restlessness -dark yellow or brown urine -difficulty breathing or swallowing -dizziness or fainting spells -drooling, shaking, movement difficulty (shuffling walk) or rigidity -fever, chills, sore throat -involuntary or uncontrollable movements of the eyes, mouth, head, arms, and legs -seizures -stomach area pain -unusually weak or tired -unusual bleeding or bruising -yellowing of skin or eyes Side effects that usually do not require medical attention (report to your doctor or health care professional if they continue or are bothersome): -difficulty passing urine -difficulty sleeping -headache -sexual dysfunction -skin rash, or itching This list may not describe all possible side effects. Call your doctor for medical advice about side effects. You may report side effects to FDA at 1-800-FDA-1088. Where should I keep my medicine? Keep out of the reach of children. Store at room temperature between 15 and 30 degrees C (59 and 86 degrees F). Protect from light. Throw away any unused medicine after the expiration date. NOTE: This sheet is a summary. It may not cover all possible information. If you have questions about this medicine, talk to your doctor, pharmacist, or health care provider.  2012, [employer]. (07/07/2008 4:26:50 PM)

## 2013-08-13 ENCOUNTER — Ambulatory Visit (INDEPENDENT_AMBULATORY_CARE_PROVIDER_SITE_OTHER): Payer: BC Managed Care – PPO | Admitting: Surgery

## 2013-08-13 ENCOUNTER — Encounter (INDEPENDENT_AMBULATORY_CARE_PROVIDER_SITE_OTHER): Payer: Self-pay | Admitting: Surgery

## 2013-08-13 ENCOUNTER — Ambulatory Visit (HOSPITAL_BASED_OUTPATIENT_CLINIC_OR_DEPARTMENT_OTHER): Payer: BC Managed Care – PPO

## 2013-08-13 ENCOUNTER — Ambulatory Visit
Admission: RE | Admit: 2013-08-13 | Discharge: 2013-08-13 | Disposition: A | Discharge status: Home / Self Care | Payer: BC Managed Care – PPO | Source: Ambulatory Visit | Attending: Radiation Oncology | Admitting: Radiation Oncology

## 2013-08-13 ENCOUNTER — Encounter: Payer: Self-pay | Admitting: *Deleted

## 2013-08-13 VITALS — BP 123/86 | HR 76 | Temp 97.6°F | Resp 18

## 2013-08-13 DIAGNOSIS — L723 Sebaceous cyst: Secondary | ICD-10-CM

## 2013-08-13 DIAGNOSIS — Z5111 Encounter for antineoplastic chemotherapy: Secondary | ICD-10-CM

## 2013-08-13 DIAGNOSIS — C7800 Secondary malignant neoplasm of unspecified lung: Secondary | ICD-10-CM

## 2013-08-13 DIAGNOSIS — C3490 Malignant neoplasm of unspecified part of unspecified bronchus or lung: Secondary | ICD-10-CM

## 2013-08-13 DIAGNOSIS — C3400 Malignant neoplasm of unspecified main bronchus: Secondary | ICD-10-CM

## 2013-08-13 DIAGNOSIS — R222 Localized swelling, mass and lump, trunk: Secondary | ICD-10-CM

## 2013-08-13 MED ORDER — ONDANSETRON 8 MG/50ML IVPB (CHCC)
8.0000 mg | Freq: Once | INTRAVENOUS | Status: AC
Start: 2013-08-13 — End: 2013-08-13
  Administered 2013-08-13: 8 mg via INTRAVENOUS

## 2013-08-13 MED ORDER — DEXAMETHASONE SODIUM PHOSPHATE 10 MG/ML IJ SOLN
10.0000 mg | Freq: Once | INTRAMUSCULAR | Status: AC
  Administered 2013-08-13: 10 mg via INTRAVENOUS

## 2013-08-13 MED ORDER — SODIUM CHLORIDE 0.9 % IV SOLN
Freq: Once | INTRAVENOUS | Status: AC
  Administered 2013-08-13: 09:00:00 via INTRAVENOUS

## 2013-08-13 MED ORDER — SODIUM CHLORIDE 0.9 % IV SOLN
120.0000 mg/m2 | Freq: Once | INTRAVENOUS | Status: AC
  Administered 2013-08-13: 240 mg via INTRAVENOUS
  Filled 2013-08-13: qty 12

## 2013-08-13 NOTE — Patient Instructions (Signed)
GENERAL SURGERY: POST OP INSTRUCTIONS  1. DIET: Follow a light bland diet the first 24 hours after arrival home, such as soup, liquids, crackers, etc.  Be sure to include lots of fluids daily.  Avoid fast food or heavy meals as your are more likely to get nauseated.   2. Take your usually prescribed home medications unless otherwise directed. 3. PAIN CONTROL: a. Pain is best controlled by a usual combination of three different methods TOGETHER: i. Ice/Heat ii. Over the counter pain medication iii. Prescription pain medication b. Most patients will experience some swelling and bruising around the incisions.  Ice packs or heating pads (30-60 minutes up to 6 times a day) will help. Use ice for the first few days to help decrease swelling and bruising, then switch to heat to help relax tight/sore spots and speed recovery.  Some people prefer to use ice alone, heat alone, alternating between ice & heat.  Experiment to what works for you.  Swelling and bruising can take several weeks to resolve.   c. It is helpful to take an over-the-counter pain medication regularly for the first few weeks.  Choose one of the following that works best for you: i. Naproxen (Aleve, etc)  Two 220mg tabs twice a day ii. Ibuprofen (Advil, etc) Three 200mg tabs four times a day (every meal & bedtime) iii. Acetaminophen (Tylenol, etc) 500-650mg four times a day (every meal & bedtime) d. A  prescription for pain medication (such as oxycodone, hydrocodone, etc) should be given to you upon discharge.  Take your pain medication as prescribed.  i. If you are having problems/concerns with the prescription medicine (does not control pain, nausea, vomiting, rash, itching, etc), please call us [phone] to see if we need to switch you to a different pain medicine that will work better for you and/or control your side effect better. ii. If you need a refill on your pain medication, please contact your pharmacy.  They will contact our  office to request authorization. Prescriptions will not be filled after 5 pm or on week-ends. 4. Avoid getting constipated.  Between the surgery and the pain medications, it is common to experience some constipation.  Increasing fluid intake and taking a fiber supplement (such as Metamucil, Citrucel, FiberCon, MiraLax, etc) 1-2 times a day regularly will usually help prevent this problem from occurring.  A mild laxative (prune juice, Milk of Magnesia, MiraLax, etc) should be taken according to package directions if there are no bowel movements after 48 hours.   5. Wash / shower every day.  You may shower over the dressings as they are waterproof.  Continue to shower over incision(s) after the dressing is off. 6. Remove your waterproof bandages 5 days after surgery.  You may leave the incision open to air.  You may have skin tapes (Steri Strips) covering the incision(s).  Leave them on until one week, then remove.  You may replace a dressing/Band-Aid to cover the incision for comfort if you wish.      7. ACTIVITIES as tolerated:   a. You may resume regular (light) daily activities beginning the next day-such as daily self-care, walking, climbing stairs-gradually increasing activities as tolerated.  If you can walk 30 minutes without difficulty, it is safe to try more intense activity such as jogging, treadmill, bicycling, low-impact aerobics, swimming, etc. b. Save the most intensive and strenuous activity for last such as sit-ups, heavy lifting, contact sports, etc  Refrain from any heavy lifting or straining until you   are off narcotics for pain control.   c. DO NOT PUSH THROUGH PAIN.  Let pain be your guide: If it hurts to do something, don't do it.  Pain is your body warning you to avoid that activity for another week until the pain goes down. d. You may drive when you are no longer taking prescription pain medication, you can comfortably wear a seatbelt, and you can safely maneuver your car and apply  brakes. e. You may have sexual intercourse when it is comfortable.  8. FOLLOW UP in our office a. Please call [facility] at [phone] to set up an appointment to see your surgeon in the office for a follow-up appointment approximately 2-3 weeks after your surgery. b. Make sure that you call for this appointment the day you arrive home to insure a convenient appointment time. 9. IF YOU HAVE DISABILITY OR FAMILY LEAVE FORMS, BRING THEM TO THE OFFICE FOR PROCESSING.  DO NOT GIVE THEM TO YOUR DOCTOR.   WHEN TO CALL US [phone]: 1. Poor pain control 2. Reactions / problems with new medications (rash/itching, nausea, etc)  3. Fever over 101.5 F (38.5 C) 4. Worsening swelling or bruising 5. Continued bleeding from incision. 6. Increased pain, redness, or drainage from the incision 7. Difficulty breathing / swallowing   The clinic staff is available to answer your questions during regular business hours (8:30am-5pm).  Please don't hesitate to call and ask to speak to one of our nurses for clinical concerns.   If you have a medical emergency, go to the nearest emergency room or call 911.  A surgeon from [facility] is always on call at the hospitals   [facility] [address], [address], [place], [place]  [address] ? MAIN: [phone] ? TOLL FREE: [phone] ?  FAX [phone] [email or web address]  Managing Pain  Pain after surgery or related to activity is often due to strain/injury to muscle, tendon, nerves and/or incisions.  This pain is usually short-term and will improve in a few months.   Many people find it helpful to do the following things TOGETHER to help speed the process of healing and to get back to regular activity more quickly:  1. Avoid heavy physical activity a.  no lifting greater than 20 pounds b. Do not "push through" the pain.  Listen to your body and avoid positions and maneuvers than reproduce the  pain c. Walking is okay as tolerated, but go slowly and stop when getting sore.  d. Remember: If it hurts to do it, then don't do it! 2. Take Anti-inflammatory medication  a. Take with food/snack around the clock for 1-2 weeks i. This helps the muscle and nerve tissues become less irritable and calm down faster b. Choose ONE of the following over-the-counter medications: i. Naproxen 220mg tabs (ex. Aleve) 1-2 pills twice a day  ii. Ibuprofen 200mg tabs (ex. Advil, Motrin) 3-4 pills with every meal and just before bedtime iii. Acetaminophen 500mg tabs (Tylenol) 1-2 pills with every meal and just before bedtime 3. Use a Heating pad or Ice/Cold Pack a. 4-6 times a day b. May use warm bath/hottub  or showers 4. Try Gentle Massage and/or Stretching  a. at the area of pain many times a day b. stop if you feel pain - do not overdo it  Try these steps together to help you body heal faster and avoid making things get worse.  Doing just one of these things may not be   enough.    If you are not getting better after two weeks or are noticing you are getting worse, contact our office for further advice; we may need to re-evaluate you & see what other things we can do to help.  Epidermal Cyst An epidermal cyst is sometimes called a sebaceous cyst, epidermal inclusion cyst, or infundibular cyst. These cysts usually contain a substance that looks "pasty" or "cheesy" and may have a bad smell. This substance is a protein called keratin. Epidermal cysts are usually found on the face, neck, or trunk. They may also occur in the vaginal area or other parts of the genitalia of both men and women. Epidermal cysts are usually small, painless, slow-growing bumps or lumps that move freely under the skin. It is important not to try to pop them. This may cause an infection and lead to tenderness and swelling. CAUSES  Epidermal cysts may be caused by a deep penetrating injury to the skin or a plugged hair follicle, often  associated with acne. SYMPTOMS  Epidermal cysts can become inflamed and cause:  Redness.  Tenderness.  Increased temperature of the skin over the bumps or lumps.  Grayish-white, bad smelling material that drains from the bump or lump. DIAGNOSIS  Epidermal cysts are easily diagnosed by your caregiver during an exam. Rarely, a tissue sample (biopsy) may be taken to rule out other conditions that may resemble epidermal cysts. TREATMENT   Epidermal cysts often get better and disappear on their own. They are rarely ever cancerous.  If a cyst becomes infected, it may become inflamed and tender. This may require opening and draining the cyst. Treatment with antibiotics may be necessary. When the infection is gone, the cyst may be removed with minor surgery.  Small, inflamed cysts can often be treated with antibiotics or by injecting steroid medicines.  Sometimes, epidermal cysts become large and bothersome. If this happens, surgical removal in your caregiver's office may be necessary. HOME CARE INSTRUCTIONS  Only take over-the-counter or prescription medicines as directed by your caregiver.  Take your antibiotics as directed. Finish them even if you start to feel better. SEEK MEDICAL CARE IF:   Your cyst becomes tender, red, or swollen.  Your condition is not improving or is getting worse.  You have any other questions or concerns. MAKE SURE YOU:  Understand these instructions.  Will watch your condition.  Will get help right away if you are not doing well or get worse. Document Released: 11/05/2004 Document Revised: 02/27/2012 Document Reviewed: 06/13/2011 Exit[employer] Patient Information 2014 [employer].  

## 2013-08-13 NOTE — Progress Notes (Signed)
[facility] Clinical Social Work  Clinical Social Work met with patient and patient's spouse in infusion room today.  CSW provided patient with [employer] [facility] disability paperwork and information on coping with cancer/how to communicate with your child.  CSW encouraged patient/spouse to utilize [employer] services for their 52 year old son.  CSW encouraged patient/spouse to contact with any questions or concerns.  Kathrin Penner, MSW, LCSW Clinical Social Worker [facility] [phone]

## 2013-08-13 NOTE — Progress Notes (Signed)
Patient ID: Ray Burns, male   DOB: 25-Aug-1961, 52 y.o.   MRN: [id number]  The pathophysiology of subcutaneous masses and differential diagnosis was discussed.  Natural history progression was discussed.  The patient's symptoms are not adequately controlled.  The mass has not resolved.  Non-operative treatment has not healed the mass.  Therefore, I recommended excision of the mass. Technique, risks, benefits, alternatives discussed.  The patient expressed understanding & wished to proceed.  I placed a field block with local anaesthetic.  I incised the skin over the mass.  I used a scalpel to sharply entered through the dermal layer into the subcutaneous tissues.  I was able to circumferentially come around the mass.  I excised it.  Because it was an obvious sebaceous cyst and benign, I did not send it to pathology.  I assured hemostasis.  Because there is no obvious infection, I decided to close the wound.  I closed it with deep dermal interrupted absorbable suture.  I closed skin with running subcuticular suture.  Steri-Strips are placed.  Sterile gauze and dressing applied.  The patient tolerated the procedure.   I discussed postop care with the patient in detail.  Instructions are written and given to them on discharge.  We will have the patient return to clinic for close follow up to make sure the incision heals.

## 2013-08-13 NOTE — Patient Instructions (Addendum)
[facility] Discharge Instructions for Patients Receiving Chemotherapy  Today you received the following chemotherapy agents Etoposide.  To help prevent nausea and vomiting after your treatment, we encourage you to take your nausea medication.   If you develop nausea and vomiting that is not controlled by your nausea medication, call the clinic.   BELOW ARE SYMPTOMS THAT SHOULD BE REPORTED IMMEDIATELY:  *FEVER GREATER THAN 100.5 F  *CHILLS WITH OR WITHOUT FEVER  NAUSEA AND VOMITING THAT IS NOT CONTROLLED WITH YOUR NAUSEA MEDICATION  *UNUSUAL SHORTNESS OF BREATH  *UNUSUAL BRUISING OR BLEEDING  TENDERNESS IN MOUTH AND THROAT WITH OR WITHOUT PRESENCE OF ULCERS  *URINARY PROBLEMS  *BOWEL PROBLEMS  UNUSUAL RASH Items with * indicate a potential emergency and should be followed up as soon as possible.  Feel free to call the clinic you have any questions or concerns. The clinic phone number is [phone].    

## 2013-08-14 ENCOUNTER — Ambulatory Visit (HOSPITAL_BASED_OUTPATIENT_CLINIC_OR_DEPARTMENT_OTHER): Payer: BC Managed Care – PPO

## 2013-08-14 ENCOUNTER — Ambulatory Visit
Admission: RE | Admit: 2013-08-14 | Discharge: 2013-08-14 | Disposition: A | Discharge status: Home / Self Care | Payer: BC Managed Care – PPO | Source: Ambulatory Visit | Attending: Radiation Oncology | Admitting: Radiation Oncology

## 2013-08-14 VITALS — BP 132/89 | HR 74 | Temp 98.2°F | Resp 18

## 2013-08-14 DIAGNOSIS — Z5111 Encounter for antineoplastic chemotherapy: Secondary | ICD-10-CM

## 2013-08-14 DIAGNOSIS — C3490 Malignant neoplasm of unspecified part of unspecified bronchus or lung: Secondary | ICD-10-CM

## 2013-08-14 DIAGNOSIS — C3400 Malignant neoplasm of unspecified main bronchus: Secondary | ICD-10-CM

## 2013-08-14 DIAGNOSIS — C7800 Secondary malignant neoplasm of unspecified lung: Secondary | ICD-10-CM

## 2013-08-14 MED ORDER — SODIUM CHLORIDE 0.9 % IV SOLN
120.0000 mg/m2 | Freq: Once | INTRAVENOUS | Status: AC
  Administered 2013-08-14: 240 mg via INTRAVENOUS
  Filled 2013-08-14: qty 12

## 2013-08-14 MED ORDER — ONDANSETRON 8 MG/50ML IVPB (CHCC)
8.0000 mg | Freq: Once | INTRAVENOUS | Status: AC
  Administered 2013-08-14: 8 mg via INTRAVENOUS

## 2013-08-14 MED ORDER — SODIUM CHLORIDE 0.9 % IV SOLN
Freq: Once | INTRAVENOUS | Status: AC
  Administered 2013-08-14: 10:00:00 via INTRAVENOUS

## 2013-08-14 MED ORDER — DEXAMETHASONE SODIUM PHOSPHATE 10 MG/ML IJ SOLN
10.0000 mg | Freq: Once | INTRAMUSCULAR | Status: AC
  Administered 2013-08-14: 10 mg via INTRAVENOUS

## 2013-08-14 NOTE — Patient Instructions (Addendum)
[facility] Discharge Instructions for Patients Receiving Chemotherapy  Today you received the following chemotherapy agents: Etoposide.  To help prevent nausea and vomiting after your treatment, we encourage you to take your nausea medication as prescribed.   If you develop nausea and vomiting that is not controlled by your nausea medication, call the clinic.   BELOW ARE SYMPTOMS THAT SHOULD BE REPORTED IMMEDIATELY:  *FEVER GREATER THAN 100.5 F  *CHILLS WITH OR WITHOUT FEVER  NAUSEA AND VOMITING THAT IS NOT CONTROLLED WITH YOUR NAUSEA MEDICATION  *UNUSUAL SHORTNESS OF BREATH  *UNUSUAL BRUISING OR BLEEDING  TENDERNESS IN MOUTH AND THROAT WITH OR WITHOUT PRESENCE OF ULCERS  *URINARY PROBLEMS  *BOWEL PROBLEMS  UNUSUAL RASH Items with * indicate a potential emergency and should be followed up as soon as possible.  Feel free to call the clinic you have any questions or concerns. The clinic phone number is [phone].    

## 2013-08-15 ENCOUNTER — Ambulatory Visit
Admission: RE | Admit: 2013-08-15 | Discharge: 2013-08-15 | Disposition: A | Discharge status: Home / Self Care | Payer: BC Managed Care – PPO | Source: Ambulatory Visit | Attending: Radiation Oncology | Admitting: Radiation Oncology

## 2013-08-15 ENCOUNTER — Ambulatory Visit (HOSPITAL_BASED_OUTPATIENT_CLINIC_OR_DEPARTMENT_OTHER): Payer: BC Managed Care – PPO

## 2013-08-15 ENCOUNTER — Telehealth: Payer: Self-pay | Admitting: *Deleted

## 2013-08-15 VITALS — BP 125/87 | HR 75 | Temp 98.6°F | Ht 70.0 in | Wt 178.1 lb

## 2013-08-15 VITALS — BP 124/86 | HR 75 | Temp 98.3°F

## 2013-08-15 DIAGNOSIS — C3490 Malignant neoplasm of unspecified part of unspecified bronchus or lung: Secondary | ICD-10-CM

## 2013-08-15 DIAGNOSIS — C3400 Malignant neoplasm of unspecified main bronchus: Secondary | ICD-10-CM

## 2013-08-15 DIAGNOSIS — Z5189 Encounter for other specified aftercare: Secondary | ICD-10-CM

## 2013-08-15 MED ORDER — SUCRALFATE 1 G PO TABS: 1.0000 g | ORAL_TABLET | Freq: Four times a day (QID) | ORAL | Status: DC

## 2013-08-15 MED ORDER — PEGFILGRASTIM INJECTION 6 MG/0.6ML
6.0000 mg | Freq: Once | SUBCUTANEOUS | Status: AC
  Administered 2013-08-15: 6 mg via SUBCUTANEOUS
  Filled 2013-08-15: qty 0.6

## 2013-08-15 NOTE — Patient Instructions (Signed)

## 2013-08-15 NOTE — Progress Notes (Signed)
   Department of Radiation Oncology  Phone:  [phone] Fax:        [phone]  Weekly Treatment Note    Name: Ray Burns Date: 08/15/2013 MRN: [id number] DOB: 08/29/1961   Current dose: 22.5 Gy  Current fraction: 9   MEDICATIONS: Current Outpatient Prescriptions  Medication Sig Dispense Refill  . acetaminophen (TYLENOL) 500 MG tablet Take 500 mg by mouth every 6 (six) hours as needed for pain.      Marland Kitchen omeprazole (PRILOSEC OTC) 20 MG tablet Take 20 mg by mouth daily.      . prochlorperazine (COMPAZINE) 10 MG tablet Take 1 tablet (10 mg total) by mouth every 6 (six) hours as needed.  30 tablet  0   No current facility-administered medications for this encounter.     ALLERGIES: Review of patient's allergies indicates no known allergies.   LABORATORY DATA:  Lab Results  Component Value Date   WBC 6.9 08/12/2013   HGB 15.8 08/12/2013   HCT 46.3 08/12/2013   MCV 92.0 08/12/2013   PLT 204 08/12/2013   Lab Results  Component Value Date   NA 140 08/12/2013   K 4.6 08/12/2013   CL 102 07/09/2013   CO2 22 08/12/2013   Lab Results  Component Value Date   ALT 29 08/12/2013   AST 27 08/12/2013   ALKPHOS 72 08/12/2013   BILITOT 0.39 08/12/2013     NARRATIVE: Ray Burns was seen today for weekly treatment management. The chart was checked and the patient's films were reviewed. The patient is doing well with treatment. He feels like his swallowing has improved. He is noticing a little bit of irritation in the upper chest and he may experience some worsening esophagitis during the remainder of his treatment. Chemotherapy has gone well.  PHYSICAL EXAMINATION: height is 5\' 10"  (1.778 m) and weight is 178 lb 1.6 oz (80.786 kg). His temperature is 98.6 F (37 C). His blood pressure is 125/87 and his pulse is 75. His oxygen saturation is 100%.        ASSESSMENT: The patient is doing satisfactorily with treatment.  PLAN: We will continue with the patient's radiation treatment as  planned. I'm going to go ahead and write a prescription for cure if they for the patient if needed.

## 2013-08-15 NOTE — Progress Notes (Signed)
Ray Burns here for weekly under treat visit with his wife.  He has had 9 fractions to his chest.  He denies pain.  He does have a dry cough.  He has shortness of breath with activity.  He does have fatigue.  He still had food stick in his chest but states that it is better.  His skin on his chest is pink.  Recommended applying radiaplex twice a day. He states that chemotherapy is going well.  He has not been nauseated at all.

## 2013-08-15 NOTE — Telephone Encounter (Signed)
Valdez here for Neulasta injection following 1st carbo/etoposide chemotherapy.  States that he is doing well, no nausea, vomiting, or diarrhea.  Eating and drinking well  All questions answered.  Knows to call if he has any problems or concerns.

## 2013-08-16 ENCOUNTER — Ambulatory Visit
Admission: RE | Admit: 2013-08-16 | Discharge: 2013-08-16 | Disposition: A | Discharge status: Home / Self Care | Payer: BC Managed Care – PPO | Source: Ambulatory Visit | Attending: Radiation Oncology | Admitting: Radiation Oncology

## 2013-08-20 ENCOUNTER — Other Ambulatory Visit (HOSPITAL_BASED_OUTPATIENT_CLINIC_OR_DEPARTMENT_OTHER): Payer: BC Managed Care – PPO

## 2013-08-20 ENCOUNTER — Telehealth: Payer: Self-pay | Admitting: Medical Oncology

## 2013-08-20 ENCOUNTER — Ambulatory Visit
Admission: RE | Admit: 2013-08-20 | Discharge: 2013-08-20 | Disposition: A | Discharge status: Home / Self Care | Payer: BC Managed Care – PPO | Source: Ambulatory Visit | Attending: Radiation Oncology | Admitting: Radiation Oncology

## 2013-08-20 DIAGNOSIS — D709 Neutropenia, unspecified: Secondary | ICD-10-CM

## 2013-08-20 DIAGNOSIS — C3490 Malignant neoplasm of unspecified part of unspecified bronchus or lung: Secondary | ICD-10-CM

## 2013-08-20 DIAGNOSIS — C3400 Malignant neoplasm of unspecified main bronchus: Secondary | ICD-10-CM

## 2013-08-20 LAB — CBC WITH DIFFERENTIAL/PLATELET
Basophils Absolute: 0 10*3/uL (ref 0.0–0.1)
Eosinophils Absolute: 0 10*3/uL (ref 0.0–0.5)
HCT: 40.9 % (ref 38.4–49.9)
LYMPH%: 32.6 % (ref 14.0–49.0)
MONO#: 0.1 10*3/uL (ref 0.1–0.9)
NEUT#: 0.5 10*3/uL — CL (ref 1.5–6.5)
NEUT%: 50.4 % (ref 39.0–75.0)
Platelets: 101 10*3/uL — ABNORMAL LOW (ref 140–400)
WBC: 1 10*3/uL — ABNORMAL LOW (ref 4.0–10.3)

## 2013-08-20 LAB — COMPREHENSIVE METABOLIC PANEL (CC13)
CO2: 25 mEq/L (ref 22–29)
Creatinine: 0.8 mg/dL (ref 0.7–1.3)
Glucose: 97 mg/dl (ref 70–140)
Total Bilirubin: 0.84 mg/dL (ref 0.20–1.20)

## 2013-08-20 MED ORDER — CIPROFLOXACIN HCL 500 MG PO TABS: 500.0000 mg | ORAL_TABLET | Freq: Two times a day (BID) | ORAL | Status: DC

## 2013-08-20 NOTE — Telephone Encounter (Signed)
CALLED WIFE AND REVIEWED NEUTROPENIC PRECAUTIONS WITH WIFE AND EXPLAINED ABOUT PICKING UP ANTIBIOTIC TO HAVE ON HAND IF HIS TEMP IS >/= 100.5 f AND TO CALL CANCER CENTER IF HE HAS FEVER.

## 2013-08-21 ENCOUNTER — Ambulatory Visit
Admission: RE | Admit: 2013-08-21 | Discharge: 2013-08-21 | Disposition: A | Discharge status: Home / Self Care | Payer: BC Managed Care – PPO | Source: Ambulatory Visit | Attending: Radiation Oncology | Admitting: Radiation Oncology

## 2013-08-22 ENCOUNTER — Ambulatory Visit
Admission: RE | Admit: 2013-08-22 | Discharge: 2013-08-22 | Disposition: A | Discharge status: Home / Self Care | Payer: BC Managed Care – PPO | Source: Ambulatory Visit | Attending: Radiation Oncology | Admitting: Radiation Oncology

## 2013-08-23 ENCOUNTER — Ambulatory Visit
Admission: RE | Admit: 2013-08-23 | Discharge: 2013-08-23 | Disposition: A | Discharge status: Home / Self Care | Payer: BC Managed Care – PPO | Source: Ambulatory Visit | Attending: Radiation Oncology | Admitting: Radiation Oncology

## 2013-08-23 ENCOUNTER — Telehealth: Payer: Self-pay | Admitting: Medical Oncology

## 2013-08-23 VITALS — BP 118/75 | HR 87 | Temp 98.2°F | Ht 70.0 in | Wt 172.4 lb

## 2013-08-23 DIAGNOSIS — C3490 Malignant neoplasm of unspecified part of unspecified bronchus or lung: Secondary | ICD-10-CM

## 2013-08-23 NOTE — Telephone Encounter (Signed)
I returned pts call . Pt is having " really bad pain in his chest like gas pressure" . His last BM was 3 days ago. He took a laxative today . He had a low grade fever of 100.0 F. I reminded wife about when to call.

## 2013-08-23 NOTE — Progress Notes (Signed)
   Department of Radiation Oncology  Phone:  [phone] Fax:        [phone]  Weekly Treatment Note    Name: Ray Burns Date: 08/23/2013 MRN: [id number] DOB: 1961-11-23   Current dose: 35 Gy  Current fraction: 14   MEDICATIONS: Current Outpatient Prescriptions  Medication Sig Dispense Refill  . acetaminophen (TYLENOL) 500 MG tablet Take 500 mg by mouth every 6 (six) hours as needed for pain.      Marland Kitchen omeprazole (PRILOSEC OTC) 20 MG tablet Take 20 mg by mouth daily.      . prochlorperazine (COMPAZINE) 10 MG tablet Take 1 tablet (10 mg total) by mouth every 6 (six) hours as needed.  30 tablet  0  . sucralfate (CARAFATE) 1 G tablet Take 1 tablet (1 g total) by mouth 4 (four) times daily.  120 tablet  1  . ciprofloxacin (CIPRO) 500 MG tablet Take 1 tablet (500 mg total) by mouth 2 (two) times daily.  20 tablet  0   No current facility-administered medications for this encounter.     ALLERGIES: Review of patient's allergies indicates no known allergies.   LABORATORY DATA:  Lab Results  Component Value Date   WBC 1.0* 08/20/2013   HGB 14.0 08/20/2013   HCT 40.9 08/20/2013   MCV 91.5 08/20/2013   PLT 101* 08/20/2013   Lab Results  Component Value Date   NA 139 08/20/2013   K 4.3 08/20/2013   CL 102 07/09/2013   CO2 25 08/20/2013   Lab Results  Component Value Date   ALT 21 08/20/2013   AST 14 08/20/2013   ALKPHOS 74 08/20/2013   BILITOT 0.84 08/20/2013     NARRATIVE: Ray Burns was seen today for weekly treatment management. The chart was checked and the patient's films were reviewed. The patient is complaining of some esophagitis. No other significant symptoms. He has one more fraction remaining.  PHYSICAL EXAMINATION: height is 5\' 10"  (1.778 m) and weight is 172 lb 6.4 oz (78.2 kg). His temperature is 98.2 F (36.8 C). His blood pressure is 118/75 and his pulse is 87. His oxygen saturation is 99%.        ASSESSMENT: The patient is doing satisfactorily with treatment.  PLAN:  We will continue with the patient's radiation treatment as planned. The patient will take Carafate and a strong heartburn medication such as Prilosec medication. He also will take pain medicine as needed.

## 2013-08-23 NOTE — Progress Notes (Addendum)
Ray Burns here for weekly under treat visit.  He has had 14 fractions to his chest.  He has pain with swallowing that he is rating at a 6/10.  He took once dose of carafate and did not see a difference.  Advised him to start taking it.  He says if feels like food expands in his chest when he eats or drinks.  He describes it as feeling like gas.  He has lost 6 lbs since 08/15/2013.  He does not want to talk to the dietician.  He also received a neulasta shot last Thursday and has been having muscle aches and a headache.  He said his temperature was 100 degrees yesterday.  His chest is pink and he has scattered red bumps on his upper chest.  He has not been using radiaplex.  Advised him to start using it tiwce a day.  He is fatigued.

## 2013-08-26 ENCOUNTER — Ambulatory Visit
Admission: RE | Admit: 2013-08-26 | Discharge: 2013-08-26 | Disposition: A | Discharge status: Home / Self Care | Payer: BC Managed Care – PPO | Source: Ambulatory Visit | Attending: Radiation Oncology | Admitting: Radiation Oncology

## 2013-08-26 ENCOUNTER — Telehealth: Payer: Self-pay | Admitting: Dietician

## 2013-08-26 ENCOUNTER — Encounter: Payer: Self-pay | Admitting: Radiation Oncology

## 2013-08-26 ENCOUNTER — Other Ambulatory Visit (HOSPITAL_BASED_OUTPATIENT_CLINIC_OR_DEPARTMENT_OTHER): Payer: BC Managed Care – PPO | Admitting: Lab

## 2013-08-26 DIAGNOSIS — C3400 Malignant neoplasm of unspecified main bronchus: Secondary | ICD-10-CM

## 2013-08-26 DIAGNOSIS — C3490 Malignant neoplasm of unspecified part of unspecified bronchus or lung: Secondary | ICD-10-CM

## 2013-08-26 LAB — CBC WITH DIFFERENTIAL/PLATELET
Eosinophils Absolute: 0 10*3/uL (ref 0.0–0.5)
HCT: 39.1 % (ref 38.4–49.9)
LYMPH%: 10.8 % — ABNORMAL LOW (ref 14.0–49.0)
MONO#: 0.8 10*3/uL (ref 0.1–0.9)
NEUT#: 4.7 10*3/uL (ref 1.5–6.5)
NEUT%: 75.6 % — ABNORMAL HIGH (ref 39.0–75.0)
Platelets: 56 10*3/uL — ABNORMAL LOW (ref 140–400)
RBC: 4.33 10*6/uL (ref 4.20–5.82)
WBC: 6.2 10*3/uL (ref 4.0–10.3)
lymph#: 0.7 10*3/uL — ABNORMAL LOW (ref 0.9–3.3)

## 2013-08-26 LAB — COMPREHENSIVE METABOLIC PANEL (CC13)
Albumin: 3.3 g/dL — ABNORMAL LOW (ref 3.5–5.0)
CO2: 26 mEq/L (ref 22–29)
Calcium: 9.2 mg/dL (ref 8.4–10.4)
Chloride: 105 mEq/L (ref 98–109)
Glucose: 91 mg/dl (ref 70–140)
Sodium: 141 mEq/L (ref 136–145)
Total Bilirubin: 0.31 mg/dL (ref 0.20–1.20)
Total Protein: 6.7 g/dL (ref 6.4–8.3)

## 2013-08-26 NOTE — Telephone Encounter (Signed)
Brief Outpatient Oncology Nutrition Note  Patient has been identified to be at risk on malnutrition screen.  Wt Readings from Last 10 Encounters:  08/23/13 172 lb 6.4 oz (78.2 kg)  08/15/13 178 lb 1.6 oz (80.786 kg)  08/12/13 178 lb 12.8 oz (81.103 kg)  08/12/13 174 lb 8 oz (79.153 kg)  08/06/13 177 lb 12.8 oz (80.65 kg)  08/01/13 174 lb 12.8 oz (79.289 kg)  08/01/13 174 lb 9.6 oz (79.198 kg)  07/19/13 179 lb 6.4 oz (81.375 kg)  07/09/13 176 lb 6.4 oz (80.015 kg)    Patient with small cell lung cancer undergoing XRT currently and chemo next week.  Called and spoke with patient's wife who reported that patient is eating but is having a difficult time secondary to increased pain with swallowing.  Has not yet tried nutritional supplements.  Pain is the same whether taking solids or water.  Meds given for this causes increased gas in the chest and increased pain.  Problems with constipation now taking a stool softener.  Recommended [facility].  Appointment referral made.  Oran Rein, RD, LDN

## 2013-08-28 ENCOUNTER — Encounter (INDEPENDENT_AMBULATORY_CARE_PROVIDER_SITE_OTHER): Payer: BC Managed Care – PPO | Admitting: Surgery

## 2013-09-02 ENCOUNTER — Telehealth: Payer: Self-pay | Admitting: Internal Medicine

## 2013-09-02 ENCOUNTER — Encounter: Payer: Self-pay | Admitting: Physician Assistant

## 2013-09-02 ENCOUNTER — Other Ambulatory Visit: Payer: BC Managed Care – PPO | Admitting: Lab

## 2013-09-02 ENCOUNTER — Ambulatory Visit: Payer: BC Managed Care – PPO | Admitting: Nutrition

## 2013-09-02 ENCOUNTER — Ambulatory Visit (HOSPITAL_BASED_OUTPATIENT_CLINIC_OR_DEPARTMENT_OTHER): Payer: BC Managed Care – PPO | Admitting: Physician Assistant

## 2013-09-02 ENCOUNTER — Other Ambulatory Visit (HOSPITAL_BASED_OUTPATIENT_CLINIC_OR_DEPARTMENT_OTHER): Payer: BC Managed Care – PPO | Admitting: Lab

## 2013-09-02 ENCOUNTER — Ambulatory Visit (HOSPITAL_BASED_OUTPATIENT_CLINIC_OR_DEPARTMENT_OTHER): Payer: BC Managed Care – PPO

## 2013-09-02 VITALS — BP 123/81 | HR 80 | Temp 97.6°F | Resp 18 | Ht 70.0 in | Wt 175.4 lb

## 2013-09-02 DIAGNOSIS — C3430 Malignant neoplasm of lower lobe, unspecified bronchus or lung: Secondary | ICD-10-CM

## 2013-09-02 DIAGNOSIS — Z5111 Encounter for antineoplastic chemotherapy: Secondary | ICD-10-CM

## 2013-09-02 DIAGNOSIS — R5381 Other malaise: Secondary | ICD-10-CM

## 2013-09-02 DIAGNOSIS — C3490 Malignant neoplasm of unspecified part of unspecified bronchus or lung: Secondary | ICD-10-CM

## 2013-09-02 DIAGNOSIS — C3410 Malignant neoplasm of upper lobe, unspecified bronchus or lung: Secondary | ICD-10-CM

## 2013-09-02 LAB — CBC WITH DIFFERENTIAL/PLATELET
Basophils Absolute: 0 10*3/uL (ref 0.0–0.1)
EOS%: 0.7 % (ref 0.0–7.0)
LYMPH%: 6.9 % — ABNORMAL LOW (ref 14.0–49.0)
MCH: 30.9 pg (ref 27.2–33.4)
MONO%: 10.2 % (ref 0.0–14.0)
NEUT#: 7.8 10*3/uL — ABNORMAL HIGH (ref 1.5–6.5)
NEUT%: 81.9 % — ABNORMAL HIGH (ref 39.0–75.0)
Platelets: 239 10*3/uL (ref 140–400)
WBC: 9.5 10*3/uL (ref 4.0–10.3)

## 2013-09-02 LAB — COMPREHENSIVE METABOLIC PANEL (CC13)
AST: 16 U/L (ref 5–34)
Alkaline Phosphatase: 74 U/L (ref 40–150)
BUN: 16.4 mg/dL (ref 7.0–26.0)
Calcium: 9.2 mg/dL (ref 8.4–10.4)
Creatinine: 0.8 mg/dL (ref 0.7–1.3)
Total Bilirubin: 0.27 mg/dL (ref 0.20–1.20)

## 2013-09-02 MED ORDER — SODIUM CHLORIDE 0.9 % IV SOLN
Freq: Once | INTRAVENOUS | Status: AC
  Administered 2013-09-02: 11:00:00 via INTRAVENOUS

## 2013-09-02 MED ORDER — ETOPOSIDE CHEMO INJECTION 1 GM/50ML
120.0000 mg/m2 | Freq: Once | INTRAVENOUS | Status: AC
  Administered 2013-09-02: 240 mg via INTRAVENOUS
  Filled 2013-09-02: qty 12

## 2013-09-02 MED ORDER — SODIUM CHLORIDE 0.9 % IV SOLN
663.0000 mg | Freq: Once | INTRAVENOUS | Status: AC
  Administered 2013-09-02: 660 mg via INTRAVENOUS
  Filled 2013-09-02: qty 66

## 2013-09-02 MED ORDER — ONDANSETRON 16 MG/50ML IVPB (CHCC)
INTRAVENOUS | Status: AC
  Filled 2013-09-02: qty 16

## 2013-09-02 MED ORDER — ONDANSETRON 16 MG/50ML IVPB (CHCC)
16.0000 mg | Freq: Once | INTRAVENOUS | Status: AC
  Administered 2013-09-02: 16 mg via INTRAVENOUS

## 2013-09-02 MED ORDER — DEXAMETHASONE SODIUM PHOSPHATE 20 MG/5ML IJ SOLN
INTRAMUSCULAR | Status: AC
  Filled 2013-09-02: qty 5

## 2013-09-02 MED ORDER — DEXAMETHASONE SODIUM PHOSPHATE 20 MG/5ML IJ SOLN
20.0000 mg | Freq: Once | INTRAMUSCULAR | Status: AC
  Administered 2013-09-02: 20 mg via INTRAVENOUS

## 2013-09-02 NOTE — Progress Notes (Signed)
Discharged and ambulatory at 1350 with spouse.  No distress noted.

## 2013-09-02 NOTE — Progress Notes (Addendum)
[facility] [phone]   [phone]  SHARED VISIT PROGRESS NOTE  Ray Ora, MD [phone] [address] [facility] [address] [place] [address]  DIAGNOSIS: Extensive stage small cell lung cancer diagnosed in August of 2014  PRIOR THERAPY: Status post palliative radiotherapy to the mediastinal lymphadenopathy under the care of Dr. Mitzi Hansen  CURRENT THERAPY: Systemic chemotherapy with carboplatin for AUC of 5 on day 1 and etoposide at 120 mg/M2 on days 1, 2 and 3 with Neulasta support on day 4. First cycle given on 08/12/2013.  CHEMOTHERAPY INTENT: Palliative  CURRENT # OF CHEMOTHERAPY CYCLES: 1  CURRENT ANTIEMETICS: Zofran, dexamethasone and Compazine  CURRENT SMOKING STATUS: Quit smoking  ORAL CHEMOTHERAPY AND CONSENT: None  CURRENT BISPHOSPHONATES USE: None  PAIN MANAGEMENT: 0/10  NARCOTICS INDUCED CONSTIPATION: None  LIVING WILL AND CODE STATUS: Full code   INTERVAL HISTORY: Ray Burns 52 y.o. male returns to the clinic today for followup visit accompanied by his wife. He complains of some fatigue. He had some mild constipation after his first cycle of chemotherapy that resolved with over-the-counter stool softeners. He also reports some achiness that lasted about one day after the Neulasta injection. Overall he tolerated his first cycle of chemotherapy relatively well. He still has some residual soreness with swallowing from his radiation therapy but states that this is gradually improving.  He denied having any significant cough, hemoptysis or shortness of breath. He denied having any recent weight loss. He tolerated his palliative radiotherapy to the mediastinal mass fairly well. The patient is here today to start cycle #2 of his systemic chemotherapy with carboplatin and etoposide.   MEDICAL HISTORY: Past Medical History  Diagnosis Date  . GERD (gastroesophageal reflux disease)     mild  . Non-small cell lung cancer      ALLERGIES:  has No Known Allergies.  MEDICATIONS:  Current Outpatient Prescriptions  Medication Sig Dispense Refill  . acetaminophen (TYLENOL) 500 MG tablet Take 500 mg by mouth every 6 (six) hours as needed for pain.      . ciprofloxacin (CIPRO) 500 MG tablet Take 1 tablet (500 mg total) by mouth 2 (two) times daily.  20 tablet  0  . omeprazole (PRILOSEC OTC) 20 MG tablet Take 20 mg by mouth daily.      . prochlorperazine (COMPAZINE) 10 MG tablet Take 1 tablet (10 mg total) by mouth every 6 (six) hours as needed.  30 tablet  0  . sucralfate (CARAFATE) 1 G tablet Take 1 tablet (1 g total) by mouth 4 (four) times daily.  120 tablet  1   No current facility-administered medications for this visit.    SURGICAL HISTORY:  Past Surgical History  Procedure Laterality Date  . Appendectomy      51 y/o  . Video bronchoscopy Bilateral 07/25/2013    Procedure: VIDEO BRONCHOSCOPY WITHOUT FLUORO;  Surgeon: [name], MD;  Location: [facility];  Service: Cardiopulmonary;  Laterality: Bilateral;    REVIEW OF SYSTEMS:  A comprehensive review of systems was negative except for: Constitutional: positive for fatigue Ears, nose, mouth, throat, and face: positive for Mild dysphasia related to his course of radiation  therapy Gastrointestinal: positive for constipation   PHYSICAL EXAMINATION: General appearance: alert, cooperative, fatigued and no distress Head: Normocephalic, without obvious abnormality, atraumatic Neck: no adenopathy Lymph nodes: Cervical, supraclavicular, and axillary nodes normal. Resp: clear to auscultation bilaterally Cardio: regular rate and rhythm, S1, S2 normal, no murmur, click, rub  or gallop GI: soft, non-tender; bowel sounds normal; no masses,  no organomegaly Extremities: extremities normal, atraumatic, no cyanosis or edema Neurologic: Alert and oriented X 3, normal strength and tone. Normal symmetric reflexes. Normal coordination and gait  ECOG PERFORMANCE  STATUS: 1 - Symptomatic but completely ambulatory  Blood pressure 123/81, pulse 80, temperature 97.6 F (36.4 C), temperature source Oral, resp. rate 18, height 5\' 10"  (1.778 m), weight 175 lb 6.4 oz (79.561 kg).  LABORATORY DATA: Lab Results  Component Value Date   WBC 9.5 09/02/2013   HGB 13.3 09/02/2013   HCT 38.7 09/02/2013   MCV 89.8 09/02/2013   PLT 239 09/02/2013      Chemistry      Component Value Date/Time   NA 141 08/26/2013 1608   NA 136 07/09/2013 1446   K 3.9 08/26/2013 1608   K 4.6 07/09/2013 1446   CL 102 07/09/2013 1446   CO2 26 08/26/2013 1608   CO2 27 07/09/2013 1446   BUN 13.3 08/26/2013 1608   BUN 17 07/09/2013 1446   CREATININE 0.9 08/26/2013 1608   CREATININE 1.0 07/09/2013 1446      Component Value Date/Time   CALCIUM 9.2 08/26/2013 1608   CALCIUM 9.8 07/09/2013 1446   ALKPHOS 72 08/26/2013 1608   ALKPHOS 62 07/09/2013 1446   AST 15 08/26/2013 1608   AST 24 07/09/2013 1446   ALT 20 08/26/2013 1608   ALT 22 07/09/2013 1446   BILITOT 0.31 08/26/2013 1608   BILITOT 0.8 07/09/2013 1446       RADIOGRAPHIC STUDIES: Dg Eye Foreign Body  08/07/2013   CLINICAL DATA:  Metal working/exposure; clearance prior to MRI  EXAM: ORBITS FOR FOREIGN BODY - 2 VIEW  COMPARISON:  Nuclear medicine PET-CT from 08/07/2013  FINDINGS: There is no evidence of metallic foreign body within the orbits. No significant bone abnormality identified.  IMPRESSION: No evidence of metallic foreign body within the orbits.   Electronically Signed   By: Herbie Baltimore   On: 08/07/2013 17:28   Mr Brain W Wo Contrast  08/07/2013   *RADIOLOGY REPORT*  Clinical Data: Small cell lung cancer.  Staging.  MRI HEAD WITHOUT AND WITH CONTRAST  Technique:  Multiplanar, multiecho pulse sequences of the brain and surrounding structures were obtained according to standard protocol without and with intravenous contrast  Contrast: 15mL MULTIHANCE GADOBENATE DIMEGLUMINE 529 MG/ML IV SOLN  Comparison: None.  Findings: Diffusion imaging  does not show any acute or subacute infarction.  The brainstem and cerebellum are normal.  The cerebral hemispheres show mild to moderate chronic small vessel changes of the deep subcortical white matter, advanced for age.  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 After contrast administration, there is no abnormal enhancement. No evidence of primary or metastatic mass.  No hydrocephalus or extra-axial collection.  No pituitary abnormality.  No skull or skull base lesion.  IMPRESSION: No evidence of metastatic disease.  Age advanced ischemic changes affecting the brain.   Original Report Authenticated By: Paulina Fusi, M.D.   Nm Pet Image Initial (pi) Skull Base To Thigh  08/07/2013   *RADIOLOGY REPORT*  Clinical Data:  Initial treatment strategy for small cell lung cancer.  NUCLEAR MEDICINE PET WHOLE BODY  Fasting Blood Glucose:  86  Technique:  17.7 mCi F-18 FDG was injected intravenously. CT data was obtained and used for attenuation correction and anatomic localization only.  (This was not acquired as a diagnostic CT examination.) Additional exam technical  data entered on technologist worksheet.  Comparison:  CT chest from 07/12/2013  Findings:  Head/Neck:   No hypermetabolic lymph nodes in the neck.  Chest:  The patients known of bulky thoracic disease is markedly hypermetabolic.  The large subcarinal lymph node has SUV max = 17. Multiple bilateral pulmonary metastases are identified.  Index 11 x 17 mm lingular nodule has SUV max = 8.  Hypermetabolic lymphadenopathy is seen in the hila bilaterally, right greater than left.  The 2.2 cm low density subcutaneous lesion in the left upper paramidline back is hypermetabolic.  Abdomen/Pelvis:  3.3 cm right adrenal mass is markedly hypermetabolic with SUV max = 14.  The patient has a large gastrohepatic ligament lymph node which is markedly hypermetabolic.  No definite evidence for hypermetabolic metastases to the liver. There is a  soft tissue nodule anterior to the spleen which is not hypermetabolic and a wall are present a tiny splenule.  Skeleton:  No focal hypermetabolic activity to suggest skeletal metastasis.  Extremities:  A small hypermetabolic lesion is identified in the right to triceps musculature.  IMPRESSION: Markedly hypermetabolic mediastinal and hilar lymphadenopathy in the chest associated with bilateral hypermetabolic pulmonary metastases.  Right adrenal metastatic lesion with hypermetabolic metastatic disease to the gastrohepatic ligament.  Hypermetabolic lesion in the right triceps musculature concerning for metastatic involvement.  A water density lesion in the subcutaneous fat of the upper left paramidline back to may represent a sebaceous cyst showing increased F D G uptake secondary to superinfection or inflammation, but necrotic metastatic lesion to this location is suspected.   Original Report Authenticated By: Kennith Center, M.D.    ASSESSMENT AND PLAN: This is a very pleasant 51 years old white male recently diagnosed with extensive stage small cell lung cancer status post short course of palliative radiotherapy to the mediastinum for relief of dysphagia and shortness of breath. He is status post one cycle of systemic chemotherapy with carboplatin and etoposide with Neulasta support. Overall he tolerated his first cycle relatively well. The patient was discussed with him also seen by Dr. Arbutus Ped. His CBC differential was reviewed and found to be within normal treatable ranges. He will proceed with cycle #2 today as scheduled he'll continue with weekly labs consisting of a CBC differential and C. met. He'll followup with Dr. Arbutus Ped in 3 weeks with restaging CT scan of the chest, abdomen and pelvis with contrast to reevaluate his disease, prior to the start of cycle #3.   Laural Benes, Oliverio Cho E, PA-C   He was advised to call immediately if he has any concerning symptoms in the interval.  The patient voices  understanding of current disease status and treatment options and is in agreement with the current care plan.  All questions were answered. The patient knows to call the clinic with any problems, questions or concerns. We can certainly see the patient much sooner if necessary.    ADDENDUM: Hematology/Oncology Attending: I have the face to face encounter with the patient today. I recommended his care planned. The patient is a very pleasant 52 years old white male with extensive stage small cell lung cancer currently undergoing systemic chemotherapy with carboplatin and etoposide status post 1 cycle. The patient also completed palliative radiotherapy to the mediastinal lymphadenopathy. He is feeling much better with improvement in his swallowing. He tolerated the first cycle of his treatment fairly well with no significant adverse effects. He has no nausea or vomiting. I recommended for the patient to proceed with cycle #2 today as  scheduled. He would come back for followup visit in 3 weeks with repeat CT scan of the chest, abdomen and pelvis for restaging of his disease. He was advised to call immediately if he has any concerning symptoms in the interval. Lajuana Matte., MD 09/02/2013

## 2013-09-02 NOTE — Progress Notes (Signed)
Patient is a 52 year old male diagnosed with extensive stage small cell lung cancer receiving palliative radiation and chemotherapy.  He is a patient of Dr. Shirline Frees.  Past medical history includes GERD.  Medications include Prilosec, Compazine, Carafate, Zofran, and dexamethasone.  Labs include albumin 3.4 on September 2.  Height: 5 feet 10 inches. Weight: 175.4 pounds. Usual body weight: 175-180 pounds. BMI: 25.17.  Patient denies nutritional side effects at this time.  He did have some painful swallowing and occasional constipation but reports both improved.  He does have increased fatigue.  He is eating a normal diet and drinks a lot of milk.  He has no nutrition concerns.  Nutrition diagnosis: Food and nutrition related knowledge deficit related to new diagnosis of lung cancer and associated treatments as evidenced by no prior need for nutrition related information.  Intervention: Patient was educated on strategies for eating small, frequent meals with higher calorie, higher protein foods.  I've recommended patient utilize [employer] essentials mixed with milk for additional protein.  I provided coupons for him to take with him today.  I've also discussed food remedies for constipation.  Fact sheets were provided and contact information given.  Teach back method used.  Monitoring, evaluation, goals: Patient will tolerate oral diet with [employer] breakfast essentials as needed to minimize weight loss throughout treatment.  Next visit: Monday, October 6, during chemotherapy.

## 2013-09-02 NOTE — Telephone Encounter (Signed)
Gave pt appt for lab ,MD and chemo for September , also gave pt oral contrast for CT, emailed Marcelino Duster for chemo appts in October 2014

## 2013-09-02 NOTE — Patient Instructions (Addendum)
Continue weekly labs as scheduled Follow with Dr. Arbutus Ped in 3 weeks with restaging CT scan of your chest, abdomen and pelvis to reevaluate your disease

## 2013-09-02 NOTE — Patient Instructions (Signed)
[facility] Discharge Instructions for Patients Receiving Chemotherapy  Today you received the following chemotherapy agents carboplatin,VP-16(etopiside)  To help prevent nausea and vomiting after your treatment, we encourage you to take your nausea medication Compazine 10 mg tablets by mouth every 6 hours as needed for nausea or vomiting   If you develop nausea and vomiting that is not controlled by your nausea medication, call the clinic.   BELOW ARE SYMPTOMS THAT SHOULD BE REPORTED IMMEDIATELY:  *FEVER GREATER THAN 100.5 F  *CHILLS WITH OR WITHOUT FEVER  NAUSEA AND VOMITING THAT IS NOT CONTROLLED WITH YOUR NAUSEA MEDICATION  *UNUSUAL SHORTNESS OF BREATH  *UNUSUAL BRUISING OR BLEEDING  TENDERNESS IN MOUTH AND THROAT WITH OR WITHOUT PRESENCE OF ULCERS  *URINARY PROBLEMS  *BOWEL PROBLEMS  UNUSUAL RASH Items with * indicate a potential emergency and should be followed up as soon as possible.  Feel free to call the clinic you have any questions or concerns. The clinic phone number is [phone].

## 2013-09-02 NOTE — Telephone Encounter (Signed)
Per staff message and POF I have scheduled appts.  JMW  

## 2013-09-03 ENCOUNTER — Telehealth: Payer: Self-pay | Admitting: Internal Medicine

## 2013-09-03 ENCOUNTER — Ambulatory Visit (HOSPITAL_BASED_OUTPATIENT_CLINIC_OR_DEPARTMENT_OTHER): Payer: BC Managed Care – PPO

## 2013-09-03 VITALS — BP 122/78 | HR 89 | Temp 97.1°F | Resp 20

## 2013-09-03 DIAGNOSIS — C3410 Malignant neoplasm of upper lobe, unspecified bronchus or lung: Secondary | ICD-10-CM

## 2013-09-03 DIAGNOSIS — Z5111 Encounter for antineoplastic chemotherapy: Secondary | ICD-10-CM

## 2013-09-03 DIAGNOSIS — C3490 Malignant neoplasm of unspecified part of unspecified bronchus or lung: Secondary | ICD-10-CM

## 2013-09-03 MED ORDER — ONDANSETRON 8 MG/NS 50 ML IVPB
INTRAVENOUS | Status: AC
  Filled 2013-09-03: qty 8

## 2013-09-03 MED ORDER — ONDANSETRON 8 MG/50ML IVPB (CHCC)
8.0000 mg | Freq: Once | INTRAVENOUS | Status: AC
  Administered 2013-09-03: 8 mg via INTRAVENOUS

## 2013-09-03 MED ORDER — DEXAMETHASONE SODIUM PHOSPHATE 10 MG/ML IJ SOLN
10.0000 mg | Freq: Once | INTRAMUSCULAR | Status: AC
  Administered 2013-09-03: 10 mg via INTRAVENOUS

## 2013-09-03 MED ORDER — DEXAMETHASONE SODIUM PHOSPHATE 10 MG/ML IJ SOLN
INTRAMUSCULAR | Status: AC
  Filled 2013-09-03: qty 1

## 2013-09-03 MED ORDER — SODIUM CHLORIDE 0.9 % IV SOLN
Freq: Once | INTRAVENOUS | Status: AC
  Administered 2013-09-03: 12:00:00 via INTRAVENOUS

## 2013-09-03 MED ORDER — ETOPOSIDE CHEMO INJECTION 1 GM/50ML
120.0000 mg/m2 | Freq: Once | INTRAVENOUS | Status: AC
  Administered 2013-09-03: 240 mg via INTRAVENOUS
  Filled 2013-09-03: qty 12

## 2013-09-03 NOTE — Patient Instructions (Addendum)
[facility] Discharge Instructions for Patients Receiving Chemotherapy  Today you received the following chemotherapy agents: Etoposide.  To help prevent nausea and vomiting after your treatment, we encourage you to take your nausea medication as prescribed.   If you develop nausea and vomiting that is not controlled by your nausea medication, call the clinic.   BELOW ARE SYMPTOMS THAT SHOULD BE REPORTED IMMEDIATELY:  *FEVER GREATER THAN 100.5 F  *CHILLS WITH OR WITHOUT FEVER  NAUSEA AND VOMITING THAT IS NOT CONTROLLED WITH YOUR NAUSEA MEDICATION  *UNUSUAL SHORTNESS OF BREATH  *UNUSUAL BRUISING OR BLEEDING  TENDERNESS IN MOUTH AND THROAT WITH OR WITHOUT PRESENCE OF ULCERS  *URINARY PROBLEMS  *BOWEL PROBLEMS  UNUSUAL RASH Items with * indicate a potential emergency and should be followed up as soon as possible.  Feel free to call the clinic you have any questions or concerns. The clinic phone number is [phone].    

## 2013-09-03 NOTE — Telephone Encounter (Signed)
Talked to pt's wife, she is aware of all aoots lab,md and chemo for September and October due to my chart

## 2013-09-04 ENCOUNTER — Ambulatory Visit (HOSPITAL_BASED_OUTPATIENT_CLINIC_OR_DEPARTMENT_OTHER): Payer: BC Managed Care – PPO

## 2013-09-04 VITALS — BP 121/73 | HR 73 | Temp 98.0°F

## 2013-09-04 DIAGNOSIS — Z5111 Encounter for antineoplastic chemotherapy: Secondary | ICD-10-CM

## 2013-09-04 DIAGNOSIS — C3490 Malignant neoplasm of unspecified part of unspecified bronchus or lung: Secondary | ICD-10-CM

## 2013-09-04 DIAGNOSIS — C3400 Malignant neoplasm of unspecified main bronchus: Secondary | ICD-10-CM

## 2013-09-04 DIAGNOSIS — C772 Secondary and unspecified malignant neoplasm of intra-abdominal lymph nodes: Secondary | ICD-10-CM

## 2013-09-04 DIAGNOSIS — C7970 Secondary malignant neoplasm of unspecified adrenal gland: Secondary | ICD-10-CM

## 2013-09-04 MED ORDER — DEXAMETHASONE SODIUM PHOSPHATE 10 MG/ML IJ SOLN
10.0000 mg | Freq: Once | INTRAMUSCULAR | Status: AC
  Administered 2013-09-04: 10 mg via INTRAVENOUS

## 2013-09-04 MED ORDER — SODIUM CHLORIDE 0.9 % IV SOLN
120.0000 mg/m2 | Freq: Once | INTRAVENOUS | Status: AC
  Administered 2013-09-04: 240 mg via INTRAVENOUS
  Filled 2013-09-04: qty 12

## 2013-09-04 MED ORDER — ONDANSETRON 8 MG/NS 50 ML IVPB
INTRAVENOUS | Status: AC
  Filled 2013-09-04: qty 8

## 2013-09-04 MED ORDER — DEXAMETHASONE SODIUM PHOSPHATE 10 MG/ML IJ SOLN
INTRAMUSCULAR | Status: AC
  Filled 2013-09-04: qty 1

## 2013-09-04 MED ORDER — SODIUM CHLORIDE 0.9 % IV SOLN
Freq: Once | INTRAVENOUS | Status: AC
  Administered 2013-09-04: 13:00:00 via INTRAVENOUS

## 2013-09-04 MED ORDER — ONDANSETRON 8 MG/50ML IVPB (CHCC)
8.0000 mg | Freq: Once | INTRAVENOUS | Status: AC
  Administered 2013-09-04: 8 mg via INTRAVENOUS

## 2013-09-04 NOTE — Patient Instructions (Addendum)
[facility] Discharge Instructions for Patients Receiving Chemotherapy  Today you received the following chemotherapy agents etoposide.  To help prevent nausea and vomiting after your treatment, we encourage you to take your nausea medication compazine.   If you develop nausea and vomiting that is not controlled by your nausea medication, call the clinic.   BELOW ARE SYMPTOMS THAT SHOULD BE REPORTED IMMEDIATELY:  *FEVER GREATER THAN 100.5 F  *CHILLS WITH OR WITHOUT FEVER  NAUSEA AND VOMITING THAT IS NOT CONTROLLED WITH YOUR NAUSEA MEDICATION  *UNUSUAL SHORTNESS OF BREATH  *UNUSUAL BRUISING OR BLEEDING  TENDERNESS IN MOUTH AND THROAT WITH OR WITHOUT PRESENCE OF ULCERS  *URINARY PROBLEMS  *BOWEL PROBLEMS  UNUSUAL RASH Items with * indicate a potential emergency and should be followed up as soon as possible.  Feel free to call the clinic you have any questions or concerns. The clinic phone number is [phone].    

## 2013-09-05 ENCOUNTER — Ambulatory Visit (HOSPITAL_BASED_OUTPATIENT_CLINIC_OR_DEPARTMENT_OTHER): Payer: BC Managed Care – PPO

## 2013-09-05 VITALS — BP 112/71 | HR 82 | Temp 98.6°F

## 2013-09-05 DIAGNOSIS — Z5189 Encounter for other specified aftercare: Secondary | ICD-10-CM

## 2013-09-05 DIAGNOSIS — C3490 Malignant neoplasm of unspecified part of unspecified bronchus or lung: Secondary | ICD-10-CM

## 2013-09-05 DIAGNOSIS — C3410 Malignant neoplasm of upper lobe, unspecified bronchus or lung: Secondary | ICD-10-CM

## 2013-09-05 MED ORDER — PEGFILGRASTIM INJECTION 6 MG/0.6ML
6.0000 mg | Freq: Once | SUBCUTANEOUS | Status: AC
  Administered 2013-09-05: 6 mg via SUBCUTANEOUS
  Filled 2013-09-05: qty 0.6

## 2013-09-05 NOTE — Progress Notes (Signed)
  Radiation Oncology         [phone] ________________________________  Name: Ray Burns MRN: [id number]  Date: 08/26/2013  DOB: Nov 20, 1961  End of Treatment Note  Diagnosis:   Metastatic/extensive stage small cell lung cancer     Indication for treatment:  Palliative       Radiation treatment dates:   08/05/2013 through 08/26/2013  Site/dose:   The patient was treated to the bulky central disease within the chest to a dose of 37.5 gray at 2.5 gray per fraction. This was accomplished using a 3 field 3-D conformal technique.  Narrative: The patient tolerated radiation treatment relatively well.   The patient experience some esophagitis towards the end of treatment.  Plan: The patient has completed radiation treatment. The patient will return to radiation oncology clinic for routine followup in one month. I advised the patient to call or return sooner if they have any questions or concerns related to their recovery or treatment. ________________________________  Radene Gunning, M.D., Ph.D.

## 2013-09-09 ENCOUNTER — Other Ambulatory Visit (HOSPITAL_BASED_OUTPATIENT_CLINIC_OR_DEPARTMENT_OTHER): Payer: BC Managed Care – PPO

## 2013-09-09 DIAGNOSIS — C3490 Malignant neoplasm of unspecified part of unspecified bronchus or lung: Secondary | ICD-10-CM

## 2013-09-09 LAB — COMPREHENSIVE METABOLIC PANEL (CC13)
ALT: 16 U/L (ref 0–55)
AST: 15 U/L (ref 5–34)
Albumin: 3.3 g/dL — ABNORMAL LOW (ref 3.5–5.0)
Alkaline Phosphatase: 115 U/L (ref 40–150)
BUN: 18.9 mg/dL (ref 7.0–26.0)
Potassium: 4.2 mEq/L (ref 3.5–5.1)
Sodium: 139 mEq/L (ref 136–145)
Total Protein: 6.6 g/dL (ref 6.4–8.3)

## 2013-09-09 LAB — CBC WITH DIFFERENTIAL/PLATELET
BASO%: 0.4 % (ref 0.0–2.0)
EOS%: 0.3 % (ref 0.0–7.0)
MCH: 31.4 pg (ref 27.2–33.4)
MCHC: 34.3 g/dL (ref 32.0–36.0)
MCV: 91.5 fL (ref 79.3–98.0)
MONO%: 2.9 % (ref 0.0–14.0)
RBC: 3.88 10*6/uL — ABNORMAL LOW (ref 4.20–5.82)
RDW: 12 % (ref 11.0–14.6)

## 2013-09-10 ENCOUNTER — Encounter (INDEPENDENT_AMBULATORY_CARE_PROVIDER_SITE_OTHER): Payer: Self-pay | Admitting: Surgery

## 2013-09-10 ENCOUNTER — Ambulatory Visit (INDEPENDENT_AMBULATORY_CARE_PROVIDER_SITE_OTHER): Payer: BC Managed Care – PPO | Admitting: Surgery

## 2013-09-10 VITALS — BP 130/72 | HR 88 | Temp 97.8°F | Resp 14 | Ht 70.0 in | Wt 176.4 lb

## 2013-09-10 DIAGNOSIS — L723 Sebaceous cyst: Secondary | ICD-10-CM

## 2013-09-10 NOTE — Patient Instructions (Signed)

## 2013-09-10 NOTE — Progress Notes (Signed)
Subjective:     Patient ID: Ray Burns, male   DOB: 03-26-1961, 52 y.o.   MRN: [id number]  HPI  Ray Burns  03-Apr-1961 [id number]  Patient Care Team: Wanda Plump, MD as PCP - General (Internal Medicine) Si Gaul, MD as Consulting Physician (Oncology) [name], MD as Consulting Physician (Pulmonary Disease) Jonna Coup, MD as Consulting Physician (Radiation Oncology)  Procedure (Date: 08/13/2013):  Excision of subcutaneous sebaceous cyst on back  This patient returns for surgical re-evaluation.  The patient feels well.  No pain or soreness.  Had mild drainage but that is gone away.  Continuing care of his lung cancer under the care of Drs. Moody and Beasley.  Patient Active Problem List   Diagnosis Date Noted  . Mass on back, prob seb cyst 08/12/2013  . Small cell lung cancer 08/01/2013  . Lung mass 07/19/2013  . Dysphagia, unspecified(787.20) 07/09/2013  . Abnormal CXR 07/09/2013    Past Medical History  Diagnosis Date  . GERD (gastroesophageal reflux disease)     mild  . Non-small cell lung cancer     Past Surgical History  Procedure Laterality Date  . Appendectomy      52 y/o  . Video bronchoscopy Bilateral 07/25/2013    Procedure: VIDEO BRONCHOSCOPY WITHOUT FLUORO;  Surgeon: [name], MD;  Location: [facility];  Service: Cardiopulmonary;  Laterality: Bilateral;    History   Social History  . Marital Status: Married    Spouse Name: N/A    Number of Children: 3  . Years of Education: N/A   Occupational History  . carpenter     Social History Main Topics  . Smoking status: Former Smoker -- 1.00 packs/day for 30 years    Types: Cigarettes    Quit date: 12/19/2008  . Smokeless tobacco: Former Neurosurgeon    Types: Chew    Quit date: 08/11/2013  . Alcohol Use: Yes     Comment: 3 times a week, 6 beers  . Drug Use: No  . Sexual Activity: Not on file   Other Topics Concern  . Not on file   Social History Narrative   Married      Family History  Problem Relation Age of Onset  . CAD Father   . Lung cancer Father     smoker  . Cancer Father     lung  . Stroke Neg Hx   . Prostate cancer Neg Hx   . Diabetes Mother   . Colon cancer Sister   . Cancer Sister     colon    Current Outpatient Prescriptions  Medication Sig Dispense Refill  . acetaminophen (TYLENOL) 500 MG tablet Take 500 mg by mouth every 6 (six) hours as needed for pain.      Marland Kitchen omeprazole (PRILOSEC OTC) 20 MG tablet Take 20 mg by mouth daily.      . prochlorperazine (COMPAZINE) 10 MG tablet Take 1 tablet (10 mg total) by mouth every 6 (six) hours as needed.  30 tablet  0  . sucralfate (CARAFATE) 1 G tablet Take 1 tablet (1 g total) by mouth 4 (four) times daily.  120 tablet  1   No current facility-administered medications for this visit.     No Known Allergies  BP 130/72  Pulse 88  Temp(Src) 97.8 F (36.6 C) (Temporal)  Resp 14  Ht 5\' 10"  (1.778 m)  Wt 176 lb 6.4 oz (80.015 kg)  BMI 25.31 kg/m2  No results found.  Review of Systems  Constitutional: Negative for fever, chills and diaphoresis.  HENT: Negative for sore throat, trouble swallowing and neck pain.   Eyes: Negative for photophobia and visual disturbance.  Respiratory: Negative for choking and shortness of breath.   Cardiovascular: Negative for chest pain and palpitations.  Gastrointestinal: Negative for nausea, vomiting, abdominal distention, anal bleeding and rectal pain.  Genitourinary: Negative for dysuria, urgency, difficulty urinating and testicular pain.  Musculoskeletal: Negative for myalgias, arthralgias and gait problem.  Skin: Negative for color change and rash.  Neurological: Negative for dizziness, speech difficulty, weakness and numbness.  Hematological: Negative for adenopathy.  Psychiatric/Behavioral: Negative for hallucinations, confusion and agitation.       Objective:   Physical Exam  Constitutional: He is oriented to person, place, and time.  He appears well-developed and well-nourished. No distress.  HENT:  Head: Normocephalic.  Mouth/Throat: Oropharynx is clear and moist. No oropharyngeal exudate.  Eyes: Conjunctivae and EOM are normal. Pupils are equal, round, and reactive to light. No scleral icterus.  Neck: Normal range of motion. No tracheal deviation present.  Cardiovascular: Normal rate, normal heart sounds and intact distal pulses.   Pulmonary/Chest: Effort normal. No respiratory distress.  Abdominal: Soft. He exhibits no distension. There is no tenderness. Hernia confirmed negative in the right inguinal area and confirmed negative in the left inguinal area.  No hernias  Musculoskeletal: Normal range of motion. He exhibits no tenderness.       Arms: Neurological: He is alert and oriented to person, place, and time. No cranial nerve deficit. He exhibits normal muscle tone. Coordination normal.  Skin: Skin is warm and dry. No rash noted. He is not diaphoretic.  Psychiatric: He has a normal mood and affect. His behavior is normal.       Assessment:     Healing well status post excision of inflamed sebaceous cyst on back     Plan:     Increase activity as tolerated to regular activity.  Low impact exercise such as walking an hour a day at least ideal.  Do not push through pain.  Return to clinic as needed.   Instructions discussed.  Followup with primary care physician for other health issues as would normally be done.  Questions answered.  The patient expressed understanding and appreciation

## 2013-09-13 ENCOUNTER — Telehealth: Payer: Self-pay | Admitting: *Deleted

## 2013-09-13 NOTE — Telephone Encounter (Signed)
Pt's wife called and left msg wanting to know what Dr Donnald Garre recommends for cold symptoms.  Called back and left a msg with instructions.  SLJ

## 2013-09-16 ENCOUNTER — Encounter: Payer: Self-pay | Admitting: Internal Medicine

## 2013-09-16 ENCOUNTER — Telehealth: Payer: Self-pay | Admitting: Medical Oncology

## 2013-09-16 ENCOUNTER — Other Ambulatory Visit (HOSPITAL_BASED_OUTPATIENT_CLINIC_OR_DEPARTMENT_OTHER): Payer: BC Managed Care – PPO | Admitting: Lab

## 2013-09-16 DIAGNOSIS — C3490 Malignant neoplasm of unspecified part of unspecified bronchus or lung: Secondary | ICD-10-CM

## 2013-09-16 LAB — CBC WITH DIFFERENTIAL/PLATELET
Basophils Absolute: 0.1 10*3/uL (ref 0.0–0.1)
EOS%: 0.8 % (ref 0.0–7.0)
HGB: 11.1 g/dL — ABNORMAL LOW (ref 13.0–17.1)
MCH: 31.9 pg (ref 27.2–33.4)
MCV: 91.2 fL (ref 79.3–98.0)
MONO%: 8.1 % (ref 0.0–14.0)
NEUT%: 84.6 % — ABNORMAL HIGH (ref 39.0–75.0)
RDW: 11.8 % (ref 11.0–14.6)

## 2013-09-16 LAB — COMPREHENSIVE METABOLIC PANEL (CC13)
AST: 12 U/L (ref 5–34)
Alkaline Phosphatase: 80 U/L (ref 40–150)
BUN: 12.1 mg/dL (ref 7.0–26.0)
Creatinine: 1 mg/dL (ref 0.7–1.3)
Potassium: 3.8 mEq/L (ref 3.5–5.1)

## 2013-09-16 NOTE — Telephone Encounter (Signed)
For 1 week pt has a bad cough when he lays flat . He occasionally coughs up yellow mucous. Denies fever. Was taking cough and cold medicine but not helping with cough. Thinks he might have bronchitis. What can he do for cough? Note to Dr Arbutus Ped.

## 2013-09-16 NOTE — Telephone Encounter (Signed)
I returned Crystals call and left message to call me back.

## 2013-09-16 NOTE — Progress Notes (Signed)
Carboplatin and etoposide are not replaceable drugs; will apply for neulasta replacement through [employer].

## 2013-09-17 ENCOUNTER — Telehealth: Payer: Self-pay | Admitting: Medical Oncology

## 2013-09-17 DIAGNOSIS — R05 Cough: Secondary | ICD-10-CM

## 2013-09-17 MED ORDER — AZITHROMYCIN 250 MG PO TABS: ORAL_TABLET | ORAL | Status: DC

## 2013-09-17 NOTE — Telephone Encounter (Signed)
Wife notified that zpak called in.

## 2013-09-17 NOTE — Telephone Encounter (Signed)
Message copied by Charma Igo on Tue Sep 17, 2013 12:46 PM ------      Message from: Si Gaul      Created: Mon Sep 16, 2013  8:07 PM       Start Z pak      ----- Message -----         From: Charma Igo, RN         Sent: 09/16/2013   6:07 PM           To: Si Gaul, MD            For 1 week pt has a bad cough when he lays flat . He occasionally coughs up yellow mucous. Denies fever. Was taking cough and cold medicine but not helping with cough. Thinks he might have bronchitis.      What can he do for cough?             ------

## 2013-09-19 ENCOUNTER — Ambulatory Visit (HOSPITAL_COMMUNITY)
Admission: RE | Admit: 2013-09-19 | Discharge: 2013-09-19 | Disposition: A | Discharge status: Home / Self Care | Payer: BC Managed Care – PPO | Source: Ambulatory Visit | Attending: Physician Assistant | Admitting: Physician Assistant

## 2013-09-19 ENCOUNTER — Encounter (HOSPITAL_COMMUNITY): Payer: Self-pay

## 2013-09-19 DIAGNOSIS — C7970 Secondary malignant neoplasm of unspecified adrenal gland: Secondary | ICD-10-CM | POA: Insufficient documentation

## 2013-09-19 DIAGNOSIS — Z923 Personal history of irradiation: Secondary | ICD-10-CM | POA: Insufficient documentation

## 2013-09-19 DIAGNOSIS — C3490 Malignant neoplasm of unspecified part of unspecified bronchus or lung: Secondary | ICD-10-CM

## 2013-09-19 DIAGNOSIS — R599 Enlarged lymph nodes, unspecified: Secondary | ICD-10-CM | POA: Insufficient documentation

## 2013-09-19 DIAGNOSIS — Z9221 Personal history of antineoplastic chemotherapy: Secondary | ICD-10-CM | POA: Insufficient documentation

## 2013-09-19 DIAGNOSIS — K7689 Other specified diseases of liver: Secondary | ICD-10-CM | POA: Insufficient documentation

## 2013-09-19 DIAGNOSIS — N281 Cyst of kidney, acquired: Secondary | ICD-10-CM | POA: Insufficient documentation

## 2013-09-19 DIAGNOSIS — J438 Other emphysema: Secondary | ICD-10-CM | POA: Insufficient documentation

## 2013-09-19 DIAGNOSIS — K5730 Diverticulosis of large intestine without perforation or abscess without bleeding: Secondary | ICD-10-CM | POA: Insufficient documentation

## 2013-09-19 MED ORDER — IOHEXOL 300 MG/ML  SOLN
100.0000 mL | Freq: Once | INTRAMUSCULAR | Status: AC | PRN
  Administered 2013-09-19: 100 mL via INTRAVENOUS

## 2013-09-23 ENCOUNTER — Encounter: Payer: Self-pay | Admitting: Internal Medicine

## 2013-09-23 ENCOUNTER — Telehealth: Payer: Self-pay | Admitting: *Deleted

## 2013-09-23 ENCOUNTER — Ambulatory Visit (HOSPITAL_BASED_OUTPATIENT_CLINIC_OR_DEPARTMENT_OTHER): Payer: BC Managed Care – PPO | Admitting: Internal Medicine

## 2013-09-23 ENCOUNTER — Ambulatory Visit: Payer: BC Managed Care – PPO | Admitting: Nutrition

## 2013-09-23 ENCOUNTER — Other Ambulatory Visit (HOSPITAL_BASED_OUTPATIENT_CLINIC_OR_DEPARTMENT_OTHER): Payer: BC Managed Care – PPO

## 2013-09-23 ENCOUNTER — Telehealth: Payer: Self-pay | Admitting: Internal Medicine

## 2013-09-23 ENCOUNTER — Other Ambulatory Visit: Payer: Self-pay | Admitting: *Deleted

## 2013-09-23 ENCOUNTER — Ambulatory Visit (HOSPITAL_BASED_OUTPATIENT_CLINIC_OR_DEPARTMENT_OTHER): Payer: BC Managed Care – PPO

## 2013-09-23 VITALS — BP 128/90 | HR 82 | Temp 97.7°F | Resp 18 | Ht 70.0 in | Wt 178.8 lb

## 2013-09-23 DIAGNOSIS — C3490 Malignant neoplasm of unspecified part of unspecified bronchus or lung: Secondary | ICD-10-CM

## 2013-09-23 DIAGNOSIS — C7800 Secondary malignant neoplasm of unspecified lung: Secondary | ICD-10-CM

## 2013-09-23 DIAGNOSIS — C3410 Malignant neoplasm of upper lobe, unspecified bronchus or lung: Secondary | ICD-10-CM

## 2013-09-23 DIAGNOSIS — Z5111 Encounter for antineoplastic chemotherapy: Secondary | ICD-10-CM

## 2013-09-23 DIAGNOSIS — C3400 Malignant neoplasm of unspecified main bronchus: Secondary | ICD-10-CM

## 2013-09-23 DIAGNOSIS — C3491 Malignant neoplasm of unspecified part of right bronchus or lung: Secondary | ICD-10-CM

## 2013-09-23 LAB — CBC WITH DIFFERENTIAL/PLATELET
BASO%: 0.3 % (ref 0.0–2.0)
EOS%: 1.1 % (ref 0.0–7.0)
Eosinophils Absolute: 0.1 10*3/uL (ref 0.0–0.5)
HCT: 35.6 % — ABNORMAL LOW (ref 38.4–49.9)
MCHC: 33.4 g/dL (ref 32.0–36.0)
MCV: 92.2 fL (ref 79.3–98.0)
MONO%: 9.8 % (ref 0.0–14.0)
NEUT#: 8.4 10*3/uL — ABNORMAL HIGH (ref 1.5–6.5)
NEUT%: 81.6 % — ABNORMAL HIGH (ref 39.0–75.0)
Platelets: 172 10*3/uL (ref 140–400)
RBC: 3.86 10*6/uL — ABNORMAL LOW (ref 4.20–5.82)
RDW: 14.1 % (ref 11.0–14.6)

## 2013-09-23 LAB — COMPREHENSIVE METABOLIC PANEL (CC13)
ALT: 28 U/L (ref 0–55)
AST: 18 U/L (ref 5–34)
Albumin: 3.6 g/dL (ref 3.5–5.0)
Alkaline Phosphatase: 75 U/L (ref 40–150)
CO2: 22 mEq/L (ref 22–29)
Creatinine: 0.8 mg/dL (ref 0.7–1.3)
Potassium: 4.4 mEq/L (ref 3.5–5.1)
Sodium: 137 mEq/L (ref 136–145)
Total Bilirubin: 0.39 mg/dL (ref 0.20–1.20)
Total Protein: 7.3 g/dL (ref 6.4–8.3)

## 2013-09-23 MED ORDER — ONDANSETRON 16 MG/50ML IVPB (CHCC)
INTRAVENOUS | Status: AC
  Filled 2013-09-23: qty 16

## 2013-09-23 MED ORDER — SODIUM CHLORIDE 0.9 % IV SOLN
609.0000 mg | Freq: Once | INTRAVENOUS | Status: AC
  Administered 2013-09-23: 610 mg via INTRAVENOUS
  Filled 2013-09-23: qty 61

## 2013-09-23 MED ORDER — DEXAMETHASONE SODIUM PHOSPHATE 20 MG/5ML IJ SOLN
INTRAMUSCULAR | Status: AC
  Filled 2013-09-23: qty 5

## 2013-09-23 MED ORDER — DEXAMETHASONE SODIUM PHOSPHATE 20 MG/5ML IJ SOLN
20.0000 mg | Freq: Once | INTRAMUSCULAR | Status: AC
  Administered 2013-09-23: 20 mg via INTRAVENOUS

## 2013-09-23 MED ORDER — SODIUM CHLORIDE 0.9 % IV SOLN
Freq: Once | INTRAVENOUS | Status: AC
  Administered 2013-09-23: 10:00:00 via INTRAVENOUS

## 2013-09-23 MED ORDER — ONDANSETRON 16 MG/50ML IVPB (CHCC)
16.0000 mg | Freq: Once | INTRAVENOUS | Status: AC
  Administered 2013-09-23: 16 mg via INTRAVENOUS

## 2013-09-23 MED ORDER — SODIUM CHLORIDE 0.9 % IV SOLN
120.0000 mg/m2 | Freq: Once | INTRAVENOUS | Status: AC
  Administered 2013-09-23: 240 mg via INTRAVENOUS
  Filled 2013-09-23: qty 12

## 2013-09-23 NOTE — Telephone Encounter (Signed)
lvm for pt to advised that appts have been extended out thru the end of OCT...pt to pick up new sched at nxt visit

## 2013-09-23 NOTE — Progress Notes (Signed)
Patient reports he feels well.  He is having some issues with constipation.  However, is working on a regimen for his bowels.  He has no nutrition related questions.  His weight is up slightly and was documented as 178.8 pounds October 6.  This is still within his usual body weight.  However, up approximately 3 pounds from mid-September.  Nutrition diagnosis: Food and nutrition related knowledge deficit improved.  Intervention: Patient was educated to continue strategies for consuming adequate calories and protein for her weight maintenance.  I have again educated patient on "food" remedies for constipation as he prefers a more natural approach.  Teach back method used.  Monitoring, evaluation, goals: Patient will continue to tolerate his oral diet with adequate calories and protein to minimize weight loss.  Next visit: Wednesday, October 29, during chemotherapy.

## 2013-09-23 NOTE — Patient Instructions (Signed)
[facility] Discharge Instructions for Patients Receiving Chemotherapy  Today you received the following chemotherapy agents :  Carboplatin,  Etoposide.  To help prevent nausea and vomiting after your treatment, we encourage you to take your nausea medication as instructed by your physician.   If you develop nausea and vomiting that is not controlled by your nausea medication, call the clinic.   BELOW ARE SYMPTOMS THAT SHOULD BE REPORTED IMMEDIATELY:  *FEVER GREATER THAN 100.5 F  *CHILLS WITH OR WITHOUT FEVER  NAUSEA AND VOMITING THAT IS NOT CONTROLLED WITH YOUR NAUSEA MEDICATION  *UNUSUAL SHORTNESS OF BREATH  *UNUSUAL BRUISING OR BLEEDING  TENDERNESS IN MOUTH AND THROAT WITH OR WITHOUT PRESENCE OF ULCERS  *URINARY PROBLEMS  *BOWEL PROBLEMS  UNUSUAL RASH Items with * indicate a potential emergency and should be followed up as soon as possible.  Feel free to call the clinic you have any questions or concerns. The clinic phone number is [phone].    

## 2013-09-23 NOTE — Patient Instructions (Signed)
CURRENT THERAPY: Systemic chemotherapy with carboplatin for AUC of 5 on day 1 and etoposide at 120 mg/M2 on days 1, 2 and 3 with Neulasta support on day 4. First cycle given on 08/12/2013.  CHEMOTHERAPY INTENT: Palliative  CURRENT # OF CHEMOTHERAPY CYCLES: 3  CURRENT ANTIEMETICS: Zofran, dexamethasone and Compazine  CURRENT SMOKING STATUS: Quit smoking  ORAL CHEMOTHERAPY AND CONSENT: None  CURRENT BISPHOSPHONATES USE: None  PAIN MANAGEMENT: 0/10  NARCOTICS INDUCED CONSTIPATION: None  LIVING WILL AND CODE STATUS: Full code

## 2013-09-23 NOTE — Telephone Encounter (Signed)
Per staff message and POF I have scheduled appts.  JMW  

## 2013-09-23 NOTE — Progress Notes (Signed)
[facility] [phone]   [phone]  OFFICE PROGRESS NOTE  Ray Ora, MD [phone] [address] [facility] [address] [place] [address]  DIAGNOSIS: Extensive stage small cell lung cancer diagnosed in August of 2014   PRIOR THERAPY: Status post palliative radiotherapy to the mediastinal lymphadenopathy under the care of Dr. Mitzi Hansen   CURRENT THERAPY: Systemic chemotherapy with carboplatin for AUC of 5 on day 1 and etoposide at 120 mg/M2 on days 1, 2 and 3 with Neulasta support on day 4. First cycle given on 08/12/2013.   CHEMOTHERAPY INTENT: Palliative  CURRENT # OF CHEMOTHERAPY CYCLES: 3 CURRENT ANTIEMETICS: Zofran, dexamethasone and Compazine  CURRENT SMOKING STATUS: Quit smoking  ORAL CHEMOTHERAPY AND CONSENT: None  CURRENT BISPHOSPHONATES USE: None  PAIN MANAGEMENT: 0/10  NARCOTICS INDUCED CONSTIPATION: None  LIVING WILL AND CODE STATUS: Full code   INTERVAL HISTORY: Ray Burns 52 y.o. male returns to the clinic today for followup visit accompanied by his wife. The patient is feeling fine today with no specific complaints except for mild odynophagia after the latest radiotherapy. He denied having any significant weight loss or night sweats. The patient denied having any chest pain, shortness breath, cough or hemoptysis. He has no fever or chills, no nausea or vomiting. He has repeat CT scan of the chest performed recently and he is here for evaluation and discussion of his scan results.  MEDICAL HISTORY: Past Medical History  Diagnosis Date  . GERD (gastroesophageal reflux disease)     mild  . Non-small cell lung cancer     ALLERGIES:  has No Known Allergies.  MEDICATIONS:  Current Outpatient Prescriptions  Medication Sig Dispense Refill  . azithromycin (ZITHROMAX Z-PAK) 250 MG tablet Take per package instructions.  6 each  0  . acetaminophen (TYLENOL) 500 MG tablet Take 500 mg by mouth every 6 (six) hours as needed for pain.        Marland Kitchen omeprazole (PRILOSEC OTC) 20 MG tablet Take 20 mg by mouth daily.      . prochlorperazine (COMPAZINE) 10 MG tablet Take 1 tablet (10 mg total) by mouth every 6 (six) hours as needed.  30 tablet  0  . sucralfate (CARAFATE) 1 G tablet Take 1 tablet (1 g total) by mouth 4 (four) times daily.  120 tablet  1   No current facility-administered medications for this visit.    SURGICAL HISTORY:  Past Surgical History  Procedure Laterality Date  . Appendectomy      52 y/o  . Video bronchoscopy Bilateral 07/25/2013    Procedure: VIDEO BRONCHOSCOPY WITHOUT FLUORO;  Surgeon: [name], MD;  Location: [facility];  Service: Cardiopulmonary;  Laterality: Bilateral;    REVIEW OF SYSTEMS:  Constitutional: negative Eyes: negative Ears, nose, mouth, throat, and face: negative Respiratory: negative Cardiovascular: negative Gastrointestinal: positive for odynophagia Genitourinary:negative Integument/breast: negative Hematologic/lymphatic: negative Musculoskeletal:negative Neurological: negative Behavioral/Psych: negative Endocrine: negative Allergic/Immunologic: negative   PHYSICAL EXAMINATION: General appearance: alert, cooperative and no distress Head: Normocephalic, without obvious abnormality, atraumatic Neck: no adenopathy, no JVD, supple, symmetrical, trachea midline and thyroid not enlarged, symmetric, no tenderness/mass/nodules Lymph nodes: Cervical, supraclavicular, and axillary nodes normal. Resp: clear to auscultation bilaterally Back: symmetric, no curvature. ROM normal. No CVA tenderness. Cardio: regular rate and rhythm, S1, S2 normal, no murmur, click, rub or gallop GI: soft, non-tender; bowel sounds normal; no masses,  no organomegaly Extremities: extremities normal, atraumatic, no cyanosis or edema Neurologic: Alert and oriented X 3,  normal strength and tone. Normal symmetric reflexes. Normal coordination and gait  ECOG PERFORMANCE STATUS: 1 - Symptomatic but completely  ambulatory  Blood pressure 128/90, pulse 82, temperature 97.7 F (36.5 C), temperature source Oral, resp. rate 18, height 5\' 10"  (1.778 m), weight 178 lb 12.8 oz (81.103 kg), SpO2 100.00%.  LABORATORY DATA: Lab Results  Component Value Date   WBC 10.3 09/23/2013   HGB 11.9* 09/23/2013   HCT 35.6* 09/23/2013   MCV 92.2 09/23/2013   PLT 172 09/23/2013      Chemistry      Component Value Date/Time   NA 139 09/16/2013 0820   NA 136 07/09/2013 1446   K 3.8 09/16/2013 0820   K 4.6 07/09/2013 1446   CL 102 07/09/2013 1446   CO2 22 09/16/2013 0820   CO2 27 07/09/2013 1446   BUN 12.1 09/16/2013 0820   BUN 17 07/09/2013 1446   CREATININE 1.0 09/16/2013 0820   CREATININE 1.0 07/09/2013 1446      Component Value Date/Time   CALCIUM 8.7 09/16/2013 0820   CALCIUM 9.8 07/09/2013 1446   ALKPHOS 80 09/16/2013 0820   ALKPHOS 62 07/09/2013 1446   AST 12 09/16/2013 0820   AST 24 07/09/2013 1446   ALT 17 09/16/2013 0820   ALT 22 07/09/2013 1446   BILITOT 0.23 09/16/2013 0820   BILITOT 0.8 07/09/2013 1446       RADIOGRAPHIC STUDIES: Ct Chest W Contrast  09/19/2013   CLINICAL DATA:  Lung cancer diagnosed 07/2013, with chemo and XRT  EXAM: CT CHEST, ABDOMEN, AND PELVIS WITH CONTRAST  TECHNIQUE: Multidetector CT imaging of the chest, abdomen and pelvis was performed following the standard protocol during bolus administration of intravenous contrast.  CONTRAST:  OMNIPAQUE IOHEXOL 300 MG/ML  SOLN  COMPARISON:  PET-CT dated 08/15/2013.  CT chest dated 07/12/2013.  FINDINGS: CT CHEST FINDINGS  Scattered bilateral pulmonary nodules/metastases. Dominant nodules include:  --11 x 11 mm right upper lobe nodule (series 4/ image 21), previously 18 x 17 mm  --12 x 15 mm left lower lobe nodule (series 4/ image 45), previously 13 x 16 mm  --10 x 14 mm right lower lobe nodule (series 4/ image 46), previously 15 x 16 mm  Mild emphysematous changes. Scattered calcified granulomata. No pleural effusion or pneumothorax.  Visualized  thyroid is unremarkable.  The heart is normal in size. No pericardial effusion. Mild atherosclerotic calcifications of the aortic arch.  Thoracic lymphadenopathy, including:  --13 mm short axis right hilar node (series 2/ image 29), previously 23 mm  --33 mm short axis subcarinal node (series 2/ image 34), previously 48 mm  --Additional calcified subcarinal nodes anteriorly, unchanged, benign  Mild subcutaneous stranding in the left upper back (series 2/image 4), without residual mass.  Degenerative changes of the thoracic spine.  CT ABDOMEN AND PELVIS FINDINGS  4 mm hypoenhancing lesion in the anterior right hepatic dome (series 2/image 53), not definitely visualized on the prior study.  Calcified splenic granulomata.  Pancreas and left adrenal gland are within normal limits.  1.6 x 1.8 cm right adrenal metastasis (series 2/ image 60), previously 2.4 x 3.3 cm.  Gallbladder is unremarkable. No intrahepatic or extrahepatic ductal dilatation.  Low mm posterior right upper pole renal cyst. Left kidney is within normal limits. No hydronephrosis.  No evidence of bowel obstruction. Prior appendectomy. Sigmoid diverticulosis, without associated inflammatory changes.  Atherosclerotic calcifications of the abdominal aorta and branch vessels.  No abdominopelvic ascites.  4.6 x 2.7 cm gastrohepatic node (  series 2/ image 59), previously 3.7 x 2.9 cm.  Prostate is unremarkable.  Bladder is within normal limits.  Degenerative changes of the lumbar spine.  IMPRESSION: CT CHEST IMPRESSION  Bilateral pulmonary nodules/metastases, measuring up to 15 mm, decreased.  Associated subcarinal/right hilar lymphadenopathy, improved.  CT ABDOMEN AND PELVIS IMPRESSION  1.8 cm right adrenal metastasis, decreased.  4.6 cm gastrohepatic node, mildly increased.  4 mm hypoenhancing lesion in the anterior right hepatic dome, possibly new. Attention on follow-up suggested.   Electronically Signed   By: Charline Bills M.D.   On: 09/19/2013 08:29    Ct Abdomen Pelvis W Contrast  09/19/2013   CLINICAL DATA:  Lung cancer diagnosed 07/2013, with chemo and XRT  EXAM: CT CHEST, ABDOMEN, AND PELVIS WITH CONTRAST  TECHNIQUE: Multidetector CT imaging of the chest, abdomen and pelvis was performed following the standard protocol during bolus administration of intravenous contrast.  CONTRAST:  OMNIPAQUE IOHEXOL 300 MG/ML  SOLN  COMPARISON:  PET-CT dated 08/15/2013.  CT chest dated 07/12/2013.  FINDINGS: CT CHEST FINDINGS  Scattered bilateral pulmonary nodules/metastases. Dominant nodules include:  --11 x 11 mm right upper lobe nodule (series 4/ image 21), previously 18 x 17 mm  --12 x 15 mm left lower lobe nodule (series 4/ image 45), previously 13 x 16 mm  --10 x 14 mm right lower lobe nodule (series 4/ image 46), previously 15 x 16 mm  Mild emphysematous changes. Scattered calcified granulomata. No pleural effusion or pneumothorax.  Visualized thyroid is unremarkable.  The heart is normal in size. No pericardial effusion. Mild atherosclerotic calcifications of the aortic arch.  Thoracic lymphadenopathy, including:  --13 mm short axis right hilar node (series 2/ image 29), previously 23 mm  --33 mm short axis subcarinal node (series 2/ image 34), previously 48 mm  --Additional calcified subcarinal nodes anteriorly, unchanged, benign  Mild subcutaneous stranding in the left upper back (series 2/image 4), without residual mass.  Degenerative changes of the thoracic spine.  CT ABDOMEN AND PELVIS FINDINGS  4 mm hypoenhancing lesion in the anterior right hepatic dome (series 2/image 53), not definitely visualized on the prior study.  Calcified splenic granulomata.  Pancreas and left adrenal gland are within normal limits.  1.6 x 1.8 cm right adrenal metastasis (series 2/ image 60), previously 2.4 x 3.3 cm.  Gallbladder is unremarkable. No intrahepatic or extrahepatic ductal dilatation.  Low mm posterior right upper pole renal cyst. Left kidney is within normal  limits. No hydronephrosis.  No evidence of bowel obstruction. Prior appendectomy. Sigmoid diverticulosis, without associated inflammatory changes.  Atherosclerotic calcifications of the abdominal aorta and branch vessels.  No abdominopelvic ascites.  4.6 x 2.7 cm gastrohepatic node (series 2/ image 59), previously 3.7 x 2.9 cm.  Prostate is unremarkable.  Bladder is within normal limits.  Degenerative changes of the lumbar spine.  IMPRESSION: CT CHEST IMPRESSION  Bilateral pulmonary nodules/metastases, measuring up to 15 mm, decreased.  Associated subcarinal/right hilar lymphadenopathy, improved.  CT ABDOMEN AND PELVIS IMPRESSION  1.8 cm right adrenal metastasis, decreased.  4.6 cm gastrohepatic node, mildly increased.  4 mm hypoenhancing lesion in the anterior right hepatic dome, possibly new. Attention on follow-up suggested.   Electronically Signed   By: Charline Bills M.D.   On: 09/19/2013 08:29    ASSESSMENT AND PLAN: This is a very pleasant 52 years old white male with extensive stage small cell lung cancer status post palliative radiotherapy to the mediastinal lymphadenopathy as well as 2 cycles of systemic chemotherapy  with carboplatin and etoposide. The patient has significant improvement in his disease especially in the chest. I discussed the scan results and showed the images to the patient and his wife. I recommended for him to continue her systemic chemotherapy was carboplatin and etoposide as scheduled. He'll receive cycle #3 today. The patient would come back for followup visit in 3 weeks with the next cycle of his chemotherapy. He was advised to call immediately if he has any concerning symptoms in the interval.  The patient voices understanding of current disease status and treatment options and is in agreement with the current care plan.  All questions were answered. The patient knows to call the clinic with any problems, questions or concerns. We can certainly see the patient much  sooner if necessary.  I spent 15 minutes counseling the patient face to face. The total time spent in the appointment was 25 minutes.

## 2013-09-24 ENCOUNTER — Ambulatory Visit (HOSPITAL_BASED_OUTPATIENT_CLINIC_OR_DEPARTMENT_OTHER): Payer: BC Managed Care – PPO

## 2013-09-24 ENCOUNTER — Ambulatory Visit: Payer: BC Managed Care – PPO

## 2013-09-24 ENCOUNTER — Encounter: Payer: Self-pay | Admitting: Radiation Oncology

## 2013-09-24 VITALS — BP 133/82 | HR 95 | Temp 96.7°F

## 2013-09-24 DIAGNOSIS — C3490 Malignant neoplasm of unspecified part of unspecified bronchus or lung: Secondary | ICD-10-CM

## 2013-09-24 DIAGNOSIS — Z5111 Encounter for antineoplastic chemotherapy: Secondary | ICD-10-CM

## 2013-09-24 DIAGNOSIS — C3410 Malignant neoplasm of upper lobe, unspecified bronchus or lung: Secondary | ICD-10-CM

## 2013-09-24 MED ORDER — SODIUM CHLORIDE 0.9 % IV SOLN
Freq: Once | INTRAVENOUS | Status: AC
  Administered 2013-09-24: 09:00:00 via INTRAVENOUS

## 2013-09-24 MED ORDER — SODIUM CHLORIDE 0.9 % IV SOLN
120.0000 mg/m2 | Freq: Once | INTRAVENOUS | Status: AC
  Administered 2013-09-24: 240 mg via INTRAVENOUS
  Filled 2013-09-24: qty 12

## 2013-09-24 MED ORDER — DEXAMETHASONE SODIUM PHOSPHATE 10 MG/ML IJ SOLN
10.0000 mg | Freq: Once | INTRAMUSCULAR | Status: AC
  Administered 2013-09-24: 10 mg via INTRAVENOUS

## 2013-09-24 MED ORDER — ONDANSETRON 8 MG/NS 50 ML IVPB
INTRAVENOUS | Status: AC
  Filled 2013-09-24: qty 8

## 2013-09-24 MED ORDER — DEXAMETHASONE SODIUM PHOSPHATE 10 MG/ML IJ SOLN
INTRAMUSCULAR | Status: AC
  Filled 2013-09-24: qty 1

## 2013-09-24 MED ORDER — ONDANSETRON 8 MG/50ML IVPB (CHCC)
8.0000 mg | Freq: Once | INTRAVENOUS | Status: AC
  Administered 2013-09-24: 8 mg via INTRAVENOUS

## 2013-09-24 NOTE — Patient Instructions (Addendum)
[facility] Discharge Instructions for Patients Receiving Chemotherapy  Today you received the following chemotherapy agents: Etoposide.  To help prevent nausea and vomiting after your treatment, we encourage you to take your nausea medication as prescribed.   If you develop nausea and vomiting that is not controlled by your nausea medication, call the clinic.   BELOW ARE SYMPTOMS THAT SHOULD BE REPORTED IMMEDIATELY:  *FEVER GREATER THAN 100.5 F  *CHILLS WITH OR WITHOUT FEVER  NAUSEA AND VOMITING THAT IS NOT CONTROLLED WITH YOUR NAUSEA MEDICATION  *UNUSUAL SHORTNESS OF BREATH  *UNUSUAL BRUISING OR BLEEDING  TENDERNESS IN MOUTH AND THROAT WITH OR WITHOUT PRESENCE OF ULCERS  *URINARY PROBLEMS  *BOWEL PROBLEMS  UNUSUAL RASH Items with * indicate a potential emergency and should be followed up as soon as possible.  Feel free to call the clinic you have any questions or concerns. The clinic phone number is [phone].    

## 2013-09-25 ENCOUNTER — Ambulatory Visit (HOSPITAL_BASED_OUTPATIENT_CLINIC_OR_DEPARTMENT_OTHER): Payer: BC Managed Care – PPO

## 2013-09-25 ENCOUNTER — Ambulatory Visit: Payer: BC Managed Care – PPO

## 2013-09-25 VITALS — BP 124/83 | HR 76 | Temp 97.0°F | Resp 16

## 2013-09-25 DIAGNOSIS — Z5111 Encounter for antineoplastic chemotherapy: Secondary | ICD-10-CM

## 2013-09-25 DIAGNOSIS — C3490 Malignant neoplasm of unspecified part of unspecified bronchus or lung: Secondary | ICD-10-CM

## 2013-09-25 DIAGNOSIS — C3410 Malignant neoplasm of upper lobe, unspecified bronchus or lung: Secondary | ICD-10-CM

## 2013-09-25 MED ORDER — DEXAMETHASONE SODIUM PHOSPHATE 10 MG/ML IJ SOLN
INTRAMUSCULAR | Status: AC
  Filled 2013-09-25: qty 1

## 2013-09-25 MED ORDER — ONDANSETRON 8 MG/NS 50 ML IVPB
INTRAVENOUS | Status: AC
  Filled 2013-09-25: qty 8

## 2013-09-25 MED ORDER — ONDANSETRON 8 MG/50ML IVPB (CHCC)
8.0000 mg | Freq: Once | INTRAVENOUS | Status: AC
  Administered 2013-09-25: 8 mg via INTRAVENOUS

## 2013-09-25 MED ORDER — DEXAMETHASONE SODIUM PHOSPHATE 10 MG/ML IJ SOLN
10.0000 mg | Freq: Once | INTRAMUSCULAR | Status: AC
  Administered 2013-09-25: 10 mg via INTRAVENOUS

## 2013-09-25 MED ORDER — SODIUM CHLORIDE 0.9 % IV SOLN
120.0000 mg/m2 | Freq: Once | INTRAVENOUS | Status: AC
  Administered 2013-09-25: 240 mg via INTRAVENOUS
  Filled 2013-09-25: qty 12

## 2013-09-25 MED ORDER — SODIUM CHLORIDE 0.9 % IV SOLN
Freq: Once | INTRAVENOUS | Status: AC
  Administered 2013-09-25: 09:00:00 via INTRAVENOUS

## 2013-09-25 NOTE — Patient Instructions (Signed)
[facility] Discharge Instructions for Patients Receiving Chemotherapy  Today you received the following chemotherapy agents ETOPOSIDE  To help prevent nausea and vomiting after your treatment, we encourage you to take your nausea medication IF NEEDED   If you develop nausea and vomiting that is not controlled by your nausea medication, call the clinic.   BELOW ARE SYMPTOMS THAT SHOULD BE REPORTED IMMEDIATELY:  *FEVER GREATER THAN 100.5 F  *CHILLS WITH OR WITHOUT FEVER  NAUSEA AND VOMITING THAT IS NOT CONTROLLED WITH YOUR NAUSEA MEDICATION  *UNUSUAL SHORTNESS OF BREATH  *UNUSUAL BRUISING OR BLEEDING  TENDERNESS IN MOUTH AND THROAT WITH OR WITHOUT PRESENCE OF ULCERS  *URINARY PROBLEMS  *BOWEL PROBLEMS  UNUSUAL RASH Items with * indicate a potential emergency and should be followed up as soon as possible.  Feel free to call the clinic you have any questions or concerns. The clinic phone number is [phone].    

## 2013-09-26 ENCOUNTER — Ambulatory Visit: Payer: Self-pay | Admitting: Radiation Oncology

## 2013-09-26 ENCOUNTER — Ambulatory Visit (HOSPITAL_BASED_OUTPATIENT_CLINIC_OR_DEPARTMENT_OTHER): Payer: BC Managed Care – PPO

## 2013-09-26 ENCOUNTER — Encounter (INDEPENDENT_AMBULATORY_CARE_PROVIDER_SITE_OTHER): Payer: Self-pay

## 2013-09-26 VITALS — BP 119/81 | HR 91 | Temp 98.5°F

## 2013-09-26 DIAGNOSIS — C3490 Malignant neoplasm of unspecified part of unspecified bronchus or lung: Secondary | ICD-10-CM

## 2013-09-26 DIAGNOSIS — C3410 Malignant neoplasm of upper lobe, unspecified bronchus or lung: Secondary | ICD-10-CM

## 2013-09-26 DIAGNOSIS — Z5189 Encounter for other specified aftercare: Secondary | ICD-10-CM

## 2013-09-26 MED ORDER — PEGFILGRASTIM INJECTION 6 MG/0.6ML
6.0000 mg | Freq: Once | SUBCUTANEOUS | Status: AC
  Administered 2013-09-26: 6 mg via SUBCUTANEOUS
  Filled 2013-09-26: qty 0.6

## 2013-09-30 ENCOUNTER — Other Ambulatory Visit (HOSPITAL_BASED_OUTPATIENT_CLINIC_OR_DEPARTMENT_OTHER): Payer: BC Managed Care – PPO | Admitting: Lab

## 2013-09-30 ENCOUNTER — Encounter (INDEPENDENT_AMBULATORY_CARE_PROVIDER_SITE_OTHER): Payer: Self-pay

## 2013-09-30 DIAGNOSIS — C3490 Malignant neoplasm of unspecified part of unspecified bronchus or lung: Secondary | ICD-10-CM

## 2013-09-30 DIAGNOSIS — C3410 Malignant neoplasm of upper lobe, unspecified bronchus or lung: Secondary | ICD-10-CM

## 2013-09-30 LAB — CBC WITH DIFFERENTIAL/PLATELET
Basophils Absolute: 0 10*3/uL (ref 0.0–0.1)
EOS%: 0.2 % (ref 0.0–7.0)
HCT: 30.3 % — ABNORMAL LOW (ref 38.4–49.9)
HGB: 10.2 g/dL — ABNORMAL LOW (ref 13.0–17.1)
LYMPH%: 3.9 % — ABNORMAL LOW (ref 14.0–49.0)
MCH: 31.3 pg (ref 27.2–33.4)
MCV: 92.9 fL (ref 79.3–98.0)
MONO%: 1.3 % (ref 0.0–14.0)
Platelets: 175 10*3/uL (ref 140–400)
RBC: 3.26 10*6/uL — ABNORMAL LOW (ref 4.20–5.82)
RDW: 14.8 % — ABNORMAL HIGH (ref 11.0–14.6)
WBC: 8.7 10*3/uL (ref 4.0–10.3)

## 2013-09-30 LAB — COMPREHENSIVE METABOLIC PANEL (CC13)
ALT: 18 U/L (ref 0–55)
AST: 13 U/L (ref 5–34)
Albumin: 3.2 g/dL — ABNORMAL LOW (ref 3.5–5.0)
Alkaline Phosphatase: 90 U/L (ref 40–150)
BUN: 19.6 mg/dL (ref 7.0–26.0)
Glucose: 186 mg/dl — ABNORMAL HIGH (ref 70–140)
Potassium: 3.8 mEq/L (ref 3.5–5.1)
Sodium: 140 mEq/L (ref 136–145)
Total Bilirubin: 0.78 mg/dL (ref 0.20–1.20)
Total Protein: 6.2 g/dL — ABNORMAL LOW (ref 6.4–8.3)

## 2013-10-03 ENCOUNTER — Encounter: Payer: Self-pay | Admitting: Radiation Oncology

## 2013-10-03 ENCOUNTER — Ambulatory Visit
Admission: RE | Admit: 2013-10-03 | Discharge: 2013-10-03 | Disposition: A | Discharge status: Home / Self Care | Payer: BC Managed Care – PPO | Source: Ambulatory Visit | Attending: Radiation Oncology | Admitting: Radiation Oncology

## 2013-10-03 VITALS — BP 121/79 | HR 106 | Temp 98.3°F | Resp 20 | Wt 179.8 lb

## 2013-10-03 DIAGNOSIS — C3490 Malignant neoplasm of unspecified part of unspecified bronchus or lung: Secondary | ICD-10-CM

## 2013-10-03 HISTORY — DX: Personal history of irradiation: Z92.3

## 2013-10-03 NOTE — Progress Notes (Signed)
Radiation Oncology         [phone] ________________________________  Name: Ray Burns MRN: [id number]  Date: 10/03/2013  DOB: [birth year]  Follow-Up Visit Note  CC: [name], MD  Wanda Plump, MD  Diagnosis:   Extensive stage small cell lung cancer  Interval Since Last Radiation:  One month   Narrative:  The patient returns today for routine follow-up.  The patient is doing fairly well. He is continuing with chemotherapy through Dr. Shirline Frees. His CT scan showed a fairly good response to treatment and he is proceeding with additional cycles. He states his breathing is present at this time. No pain.                              ALLERGIES:  has No Known Allergies.  Meds: Current Outpatient Prescriptions  Medication Sig Dispense Refill  . omeprazole (PRILOSEC OTC) 20 MG tablet Take 20 mg by mouth daily.      Marland Kitchen acetaminophen (TYLENOL) 500 MG tablet Take 500 mg by mouth every 6 (six) hours as needed for pain.      Marland Kitchen azithromycin (ZITHROMAX Z-PAK) 250 MG tablet Take per package instructions.  6 each  0  . prochlorperazine (COMPAZINE) 10 MG tablet Take 1 tablet (10 mg total) by mouth every 6 (six) hours as needed.  30 tablet  0  . sucralfate (CARAFATE) 1 G tablet Take 1 tablet (1 g total) by mouth 4 (four) times daily.  120 tablet  1   No current facility-administered medications for this encounter.    Physical Findings: The patient is in no acute distress. Patient is alert and oriented.  weight is 179 lb 12.8 oz (81.557 kg). His oral temperature is 98.3 F (36.8 C). His blood pressure is 121/79 and his pulse is 106. His respiration is 20 and oxygen saturation is 96%. .   General: Well-developed, in no acute distress HEENT: Normocephalic, atraumatic Cardiovascular: Regular rate and rhythm Respiratory: Clear to auscultation bilaterally GI: Soft, nontender, normal bowel sounds Extremities: No edema present   Lab Findings: Lab Results  Component Value Date   WBC 8.7 09/30/2013    HGB 10.2* 09/30/2013   HCT 30.3* 09/30/2013   MCV 92.9 09/30/2013   PLT 175 09/30/2013     Radiographic Findings: Ct Chest W Contrast  09/19/2013   CLINICAL DATA:  Lung cancer diagnosed 07/2013, with chemo and XRT  EXAM: CT CHEST, ABDOMEN, AND PELVIS WITH CONTRAST  TECHNIQUE: Multidetector CT imaging of the chest, abdomen and pelvis was performed following the standard protocol during bolus administration of intravenous contrast.  CONTRAST:  OMNIPAQUE IOHEXOL 300 MG/ML  SOLN  COMPARISON:  PET-CT dated 08/15/2013.  CT chest dated 07/12/2013.  FINDINGS: CT CHEST FINDINGS  Scattered bilateral pulmonary nodules/metastases. Dominant nodules include:  --11 x 11 mm right upper lobe nodule (series 4/ image 21), previously 18 x 17 mm  --12 x 15 mm left lower lobe nodule (series 4/ image 45), previously 13 x 16 mm  --10 x 14 mm right lower lobe nodule (series 4/ image 46), previously 15 x 16 mm  Mild emphysematous changes. Scattered calcified granulomata. No pleural effusion or pneumothorax.  Visualized thyroid is unremarkable.  The heart is normal in size. No pericardial effusion. Mild atherosclerotic calcifications of the aortic arch.  Thoracic lymphadenopathy, including:  --13 mm short axis right hilar node (series 2/ image 29), previously 23 mm  --33 mm short axis subcarinal node (  series 2/ image 34), previously 48 mm  --Additional calcified subcarinal nodes anteriorly, unchanged, benign  Mild subcutaneous stranding in the left upper back (series 2/image 4), without residual mass.  Degenerative changes of the thoracic spine.  CT ABDOMEN AND PELVIS FINDINGS  4 mm hypoenhancing lesion in the anterior right hepatic dome (series 2/image 53), not definitely visualized on the prior study.  Calcified splenic granulomata.  Pancreas and left adrenal gland are within normal limits.  1.6 x 1.8 cm right adrenal metastasis (series 2/ image 60), previously 2.4 x 3.3 cm.  Gallbladder is unremarkable. No intrahepatic or  extrahepatic ductal dilatation.  Low mm posterior right upper pole renal cyst. Left kidney is within normal limits. No hydronephrosis.  No evidence of bowel obstruction. Prior appendectomy. Sigmoid diverticulosis, without associated inflammatory changes.  Atherosclerotic calcifications of the abdominal aorta and branch vessels.  No abdominopelvic ascites.  4.6 x 2.7 cm gastrohepatic node (series 2/ image 59), previously 3.7 x 2.9 cm.  Prostate is unremarkable.  Bladder is within normal limits.  Degenerative changes of the lumbar spine.  IMPRESSION: CT CHEST IMPRESSION  Bilateral pulmonary nodules/metastases, measuring up to 15 mm, decreased.  Associated subcarinal/right hilar lymphadenopathy, improved.  CT ABDOMEN AND PELVIS IMPRESSION  1.8 cm right adrenal metastasis, decreased.  4.6 cm gastrohepatic node, mildly increased.  4 mm hypoenhancing lesion in the anterior right hepatic dome, possibly new. Attention on follow-up suggested.   Electronically Signed   By: Charline Bills M.D.   On: 09/19/2013 08:29   Ct Abdomen Pelvis W Contrast  09/19/2013   CLINICAL DATA:  Lung cancer diagnosed 07/2013, with chemo and XRT  EXAM: CT CHEST, ABDOMEN, AND PELVIS WITH CONTRAST  TECHNIQUE: Multidetector CT imaging of the chest, abdomen and pelvis was performed following the standard protocol during bolus administration of intravenous contrast.  CONTRAST:  OMNIPAQUE IOHEXOL 300 MG/ML  SOLN  COMPARISON:  PET-CT dated 08/15/2013.  CT chest dated 07/12/2013.  FINDINGS: CT CHEST FINDINGS  Scattered bilateral pulmonary nodules/metastases. Dominant nodules include:  --11 x 11 mm right upper lobe nodule (series 4/ image 21), previously 18 x 17 mm  --12 x 15 mm left lower lobe nodule (series 4/ image 45), previously 13 x 16 mm  --10 x 14 mm right lower lobe nodule (series 4/ image 46), previously 15 x 16 mm  Mild emphysematous changes. Scattered calcified granulomata. No pleural effusion or pneumothorax.  Visualized thyroid is  unremarkable.  The heart is normal in size. No pericardial effusion. Mild atherosclerotic calcifications of the aortic arch.  Thoracic lymphadenopathy, including:  --13 mm short axis right hilar node (series 2/ image 29), previously 23 mm  --33 mm short axis subcarinal node (series 2/ image 34), previously 48 mm  --Additional calcified subcarinal nodes anteriorly, unchanged, benign  Mild subcutaneous stranding in the left upper back (series 2/image 4), without residual mass.  Degenerative changes of the thoracic spine.  CT ABDOMEN AND PELVIS FINDINGS  4 mm hypoenhancing lesion in the anterior right hepatic dome (series 2/image 53), not definitely visualized on the prior study.  Calcified splenic granulomata.  Pancreas and left adrenal gland are within normal limits.  1.6 x 1.8 cm right adrenal metastasis (series 2/ image 60), previously 2.4 x 3.3 cm.  Gallbladder is unremarkable. No intrahepatic or extrahepatic ductal dilatation.  Low mm posterior right upper pole renal cyst. Left kidney is within normal limits. No hydronephrosis.  No evidence of bowel obstruction. Prior appendectomy. Sigmoid diverticulosis, without associated inflammatory changes.  Atherosclerotic calcifications  of the abdominal aorta and branch vessels.  No abdominopelvic ascites.  4.6 x 2.7 cm gastrohepatic node (series 2/ image 59), previously 3.7 x 2.9 cm.  Prostate is unremarkable.  Bladder is within normal limits.  Degenerative changes of the lumbar spine.  IMPRESSION: CT CHEST IMPRESSION  Bilateral pulmonary nodules/metastases, measuring up to 15 mm, decreased.  Associated subcarinal/right hilar lymphadenopathy, improved.  CT ABDOMEN AND PELVIS IMPRESSION  1.8 cm right adrenal metastasis, decreased.  4.6 cm gastrohepatic node, mildly increased.  4 mm hypoenhancing lesion in the anterior right hepatic dome, possibly new. Attention on follow-up suggested.   Electronically Signed   By: Charline Bills M.D.   On: 09/19/2013 08:29     Impression:    The patient is doing satisfactorily one month after a palliative course of radiation treatment. He is continuing with chemotherapy for a total of 6 cycles. We discussed the possibility of prophylactic cranial irradiation at the appropriate time. We will discuss this further at his next visit. I also discussed that we could treat additional areas of residual disease if necessary depending on his response.  Plan:  Followup in 3 months.   Radene Gunning, M.D., Ph.D.

## 2013-10-03 NOTE — Progress Notes (Signed)
Follow up lung rad txs: 08/05/13-08/26/13, has a cough again, just completed z-pack rx 2 weeks ago, feels he is getting a  cold in his chest again, at night coughs mopre yellowish pheglm, fatigued, appetite good, drinking plenty fluids, 96% room air, get s sob after playing ball with his son, CT 09/19/13 11:39 AM

## 2013-10-07 ENCOUNTER — Other Ambulatory Visit (HOSPITAL_BASED_OUTPATIENT_CLINIC_OR_DEPARTMENT_OTHER): Payer: BC Managed Care – PPO

## 2013-10-07 ENCOUNTER — Encounter (INDEPENDENT_AMBULATORY_CARE_PROVIDER_SITE_OTHER): Payer: Self-pay

## 2013-10-07 DIAGNOSIS — C3430 Malignant neoplasm of lower lobe, unspecified bronchus or lung: Secondary | ICD-10-CM

## 2013-10-07 DIAGNOSIS — C3490 Malignant neoplasm of unspecified part of unspecified bronchus or lung: Secondary | ICD-10-CM

## 2013-10-07 DIAGNOSIS — C3410 Malignant neoplasm of upper lobe, unspecified bronchus or lung: Secondary | ICD-10-CM

## 2013-10-07 LAB — CBC WITH DIFFERENTIAL/PLATELET
Eosinophils Absolute: 0.1 10*3/uL (ref 0.0–0.5)
HCT: 28.3 % — ABNORMAL LOW (ref 38.4–49.9)
LYMPH%: 11.4 % — ABNORMAL LOW (ref 14.0–49.0)
MCV: 93.7 fL (ref 79.3–98.0)
MONO#: 0.4 10*3/uL (ref 0.1–0.9)
MONO%: 8.1 % (ref 0.0–14.0)
NEUT#: 4.3 10*3/uL (ref 1.5–6.5)
NEUT%: 78.5 % — ABNORMAL HIGH (ref 39.0–75.0)
Platelets: 47 10*3/uL — ABNORMAL LOW (ref 140–400)
RBC: 3.02 10*6/uL — ABNORMAL LOW (ref 4.20–5.82)
RDW: 14.6 % (ref 11.0–14.6)
WBC: 5.4 10*3/uL (ref 4.0–10.3)

## 2013-10-07 LAB — COMPREHENSIVE METABOLIC PANEL (CC13)
Alkaline Phosphatase: 74 U/L (ref 40–150)
Anion Gap: 11 mEq/L (ref 3–11)
BUN: 11.1 mg/dL (ref 7.0–26.0)
CO2: 23 mEq/L (ref 22–29)
Creatinine: 0.9 mg/dL (ref 0.7–1.3)
Glucose: 185 mg/dl — ABNORMAL HIGH (ref 70–140)
Sodium: 143 mEq/L (ref 136–145)
Total Bilirubin: 0.28 mg/dL (ref 0.20–1.20)
Total Protein: 6.4 g/dL (ref 6.4–8.3)

## 2013-10-14 ENCOUNTER — Telehealth: Payer: Self-pay | Admitting: *Deleted

## 2013-10-14 ENCOUNTER — Other Ambulatory Visit: Payer: BC Managed Care – PPO | Admitting: Lab

## 2013-10-14 ENCOUNTER — Encounter: Payer: Self-pay | Admitting: Physician Assistant

## 2013-10-14 ENCOUNTER — Telehealth: Payer: Self-pay | Admitting: Internal Medicine

## 2013-10-14 ENCOUNTER — Other Ambulatory Visit (HOSPITAL_BASED_OUTPATIENT_CLINIC_OR_DEPARTMENT_OTHER): Payer: BC Managed Care – PPO | Admitting: Lab

## 2013-10-14 ENCOUNTER — Ambulatory Visit (HOSPITAL_BASED_OUTPATIENT_CLINIC_OR_DEPARTMENT_OTHER): Payer: BC Managed Care – PPO | Admitting: Physician Assistant

## 2013-10-14 ENCOUNTER — Telehealth: Payer: Self-pay | Admitting: Medical Oncology

## 2013-10-14 ENCOUNTER — Encounter: Payer: Self-pay | Admitting: Internal Medicine

## 2013-10-14 ENCOUNTER — Ambulatory Visit (HOSPITAL_BASED_OUTPATIENT_CLINIC_OR_DEPARTMENT_OTHER): Payer: BC Managed Care – PPO

## 2013-10-14 VITALS — BP 124/84 | HR 83 | Temp 96.9°F | Resp 20 | Ht 70.0 in | Wt 183.6 lb

## 2013-10-14 DIAGNOSIS — C3400 Malignant neoplasm of unspecified main bronchus: Secondary | ICD-10-CM

## 2013-10-14 DIAGNOSIS — C7800 Secondary malignant neoplasm of unspecified lung: Secondary | ICD-10-CM

## 2013-10-14 DIAGNOSIS — Z5111 Encounter for antineoplastic chemotherapy: Secondary | ICD-10-CM

## 2013-10-14 DIAGNOSIS — C3490 Malignant neoplasm of unspecified part of unspecified bronchus or lung: Secondary | ICD-10-CM

## 2013-10-14 DIAGNOSIS — C7970 Secondary malignant neoplasm of unspecified adrenal gland: Secondary | ICD-10-CM

## 2013-10-14 LAB — CBC WITH DIFFERENTIAL/PLATELET
BASO%: 0.5 % (ref 0.0–2.0)
Basophils Absolute: 0 10*3/uL (ref 0.0–0.1)
EOS%: 1.2 % (ref 0.0–7.0)
Eosinophils Absolute: 0.1 10*3/uL (ref 0.0–0.5)
HCT: 31.5 % — ABNORMAL LOW (ref 38.4–49.9)
HGB: 10.5 g/dL — ABNORMAL LOW (ref 13.0–17.1)
MCHC: 33.3 g/dL (ref 32.0–36.0)
MONO#: 0.7 10*3/uL (ref 0.1–0.9)
NEUT#: 5.2 10*3/uL (ref 1.5–6.5)
NEUT%: 77.7 % — ABNORMAL HIGH (ref 39.0–75.0)
Platelets: 131 10*3/uL — ABNORMAL LOW (ref 140–400)
WBC: 6.7 10*3/uL (ref 4.0–10.3)
lymph#: 0.7 10*3/uL — ABNORMAL LOW (ref 0.9–3.3)

## 2013-10-14 LAB — COMPREHENSIVE METABOLIC PANEL (CC13)
ALT: 25 U/L (ref 0–55)
AST: 23 U/L (ref 5–34)
Albumin: 3.8 g/dL (ref 3.5–5.0)
Anion Gap: 11 mEq/L (ref 3–11)
BUN: 16.6 mg/dL (ref 7.0–26.0)
CO2: 24 mEq/L (ref 22–29)
Calcium: 9.4 mg/dL (ref 8.4–10.4)
Chloride: 105 mEq/L (ref 98–109)
Glucose: 80 mg/dl (ref 70–140)
Potassium: 4.1 mEq/L (ref 3.5–5.1)
Total Bilirubin: 0.54 mg/dL (ref 0.20–1.20)

## 2013-10-14 MED ORDER — SODIUM CHLORIDE 0.9 % IV SOLN
120.0000 mg/m2 | Freq: Once | INTRAVENOUS | Status: AC
  Administered 2013-10-14: 240 mg via INTRAVENOUS
  Filled 2013-10-14: qty 12

## 2013-10-14 MED ORDER — ONDANSETRON 16 MG/50ML IVPB (CHCC)
INTRAVENOUS | Status: AC
  Filled 2013-10-14: qty 16

## 2013-10-14 MED ORDER — DEXAMETHASONE SODIUM PHOSPHATE 20 MG/5ML IJ SOLN
20.0000 mg | Freq: Once | INTRAMUSCULAR | Status: AC
  Administered 2013-10-14: 20 mg via INTRAVENOUS

## 2013-10-14 MED ORDER — ONDANSETRON 8 MG/NS 50 ML IVPB
INTRAVENOUS | Status: AC
  Filled 2013-10-14: qty 8

## 2013-10-14 MED ORDER — SODIUM CHLORIDE 0.9 % IV SOLN
Freq: Once | INTRAVENOUS | Status: AC
  Administered 2013-10-14: 11:00:00 via INTRAVENOUS

## 2013-10-14 MED ORDER — ONDANSETRON 16 MG/50ML IVPB (CHCC)
16.0000 mg | Freq: Once | INTRAVENOUS | Status: AC
  Administered 2013-10-14: 16 mg via INTRAVENOUS

## 2013-10-14 MED ORDER — DEXAMETHASONE SODIUM PHOSPHATE 10 MG/ML IJ SOLN
INTRAMUSCULAR | Status: AC
  Filled 2013-10-14: qty 1

## 2013-10-14 MED ORDER — CARBOPLATIN CHEMO INJECTION 600 MG/60ML
610.0000 mg | Freq: Once | INTRAVENOUS | Status: AC
  Administered 2013-10-14: 610 mg via INTRAVENOUS
  Filled 2013-10-14: qty 61

## 2013-10-14 MED ORDER — DEXAMETHASONE SODIUM PHOSPHATE 20 MG/5ML IJ SOLN
INTRAMUSCULAR | Status: AC
  Filled 2013-10-14: qty 5

## 2013-10-14 NOTE — Patient Instructions (Signed)
Continue with weekly labs as scheduled Followup with Dr. Arbutus Ped in 3 weeks with restaging CT scan of the chest, abdomen and pelvis to reevaluate your disease

## 2013-10-14 NOTE — Telephone Encounter (Signed)
[facility] TX request sent for injection appt on 10/17/13

## 2013-10-14 NOTE — Telephone Encounter (Signed)
Gave pt appt for lab,md and chemo forOctober and November 2014

## 2013-10-14 NOTE — Patient Instructions (Signed)
[facility] Discharge Instructions for Patients Receiving Chemotherapy  Today you received the following chemotherapy agents:  Carboplatin and Etoposide.  To help prevent nausea and vomiting after your treatment, we encourage you to take your nausea medication as ordered per MD.   If you develop nausea and vomiting that is not controlled by your nausea medication, call the clinic.   BELOW ARE SYMPTOMS THAT SHOULD BE REPORTED IMMEDIATELY:  *FEVER GREATER THAN 100.5 F  *CHILLS WITH OR WITHOUT FEVER  NAUSEA AND VOMITING THAT IS NOT CONTROLLED WITH YOUR NAUSEA MEDICATION  *UNUSUAL SHORTNESS OF BREATH  *UNUSUAL BRUISING OR BLEEDING  TENDERNESS IN MOUTH AND THROAT WITH OR WITHOUT PRESENCE OF ULCERS  *URINARY PROBLEMS  *BOWEL PROBLEMS  UNUSUAL RASH Items with * indicate a potential emergency and should be followed up as soon as possible.  Feel free to call the clinic you have any questions or concerns. The clinic phone number is [phone].    

## 2013-10-14 NOTE — Progress Notes (Addendum)
[facility] [phone]   [phone]  OFFICE PROGRESS NOTE  [name], MD [phone] [facility] [address] [place] [address]  DIAGNOSIS: Extensive stage small cell lung cancer diagnosed in August of 2014   PRIOR THERAPY: Status post palliative radiotherapy to the mediastinal lymphadenopathy under the care of Dr. Mitzi Hansen   CURRENT THERAPY: Systemic chemotherapy with carboplatin for AUC of 5 on day 1 and etoposide at 120 mg/M2 on days 1, 2 and 3 with Neulasta support on day 4. First cycle given on 08/12/2013. Status post 3 cycles  CHEMOTHERAPY INTENT: Palliative  CURRENT # OF CHEMOTHERAPY CYCLES: 4 CURRENT ANTIEMETICS: Zofran, dexamethasone and Compazine  CURRENT SMOKING STATUS: Quit smoking  ORAL CHEMOTHERAPY AND CONSENT: None  CURRENT BISPHOSPHONATES USE: None  PAIN MANAGEMENT: 0/10  NARCOTICS INDUCED CONSTIPATION: None  LIVING WILL AND CODE STATUS: Full code   INTERVAL HISTORY: Ray Burns 52 y.o. male returns to the clinic today for followup visit.  The patient is feeling fine today with no specific complaints except for mild odynophagia after the latest radiotherapy. He denied having any significant weight loss or night sweats. The patient denied having any chest pain, shortness breath, cough or hemoptysis. He has no fever or chills, no nausea or vomiting. He is tolerating his chemotherapy without difficulty with the exception of some mild constipation that is well-controlled with over-the-counter stool softeners. This constipation tends to occur the first few days after chemotherapy. He also reports some shortness of breath but feels that this is more related to decreased physical activity and deconditioning. He reports he has chest cold after his last Neulasta injection but all these symptoms have resolved.  MEDICAL HISTORY: Past Medical History  Diagnosis Date  . GERD (gastroesophageal reflux disease)     mild  . Non-small  cell lung cancer   . Hx of radiation therapy 08/05/13-08/26/13    chest/lung cancer/ 37.5Gy    ALLERGIES:  has No Known Allergies.  MEDICATIONS:  Current Outpatient Prescriptions  Medication Sig Dispense Refill  . acetaminophen (TYLENOL) 500 MG tablet Take 500 mg by mouth every 6 (six) hours as needed for pain.      Marland Kitchen omeprazole (PRILOSEC OTC) 20 MG tablet Take 20 mg by mouth daily.      . prochlorperazine (COMPAZINE) 10 MG tablet Take 1 tablet (10 mg total) by mouth every 6 (six) hours as needed.  30 tablet  0  . sucralfate (CARAFATE) 1 G tablet Take 1 tablet (1 g total) by mouth 4 (four) times daily.  120 tablet  1   No current facility-administered medications for this visit.    SURGICAL HISTORY:  Past Surgical History  Procedure Laterality Date  . Appendectomy      52 y/o  . Video bronchoscopy Bilateral 07/25/2013    Procedure: VIDEO BRONCHOSCOPY WITHOUT FLUORO;  Surgeon: [name], MD;  Location: [facility];  Service: Cardiopulmonary;  Laterality: Bilateral;    REVIEW OF SYSTEMS:  Constitutional: negative Eyes: negative Ears, nose, mouth, throat, and face: negative Respiratory: negative Cardiovascular: negative Gastrointestinal: positive for odynophagia Genitourinary:negative Integument/breast: negative Hematologic/lymphatic: negative Musculoskeletal:negative Neurological: negative Behavioral/Psych: negative Endocrine: negative Allergic/Immunologic: negative   PHYSICAL EXAMINATION: General appearance: alert, cooperative and no distress Head: Normocephalic, without obvious abnormality, atraumatic Neck: no adenopathy, no JVD, supple, symmetrical, trachea midline and thyroid not enlarged, symmetric, no tenderness/mass/nodules Lymph nodes: Cervical, supraclavicular, and axillary nodes normal. Resp: clear to auscultation bilaterally Back: symmetric, no curvature. ROM normal.  No CVA tenderness. Cardio: regular rate and rhythm, S1, S2 normal, no murmur, click, rub or  gallop GI: soft, non-tender; bowel sounds normal; no masses,  no organomegaly Extremities: extremities normal, atraumatic, no cyanosis or edema Neurologic: Alert and oriented X 3, normal strength and tone. Normal symmetric reflexes. Normal coordination and gait  ECOG PERFORMANCE STATUS: 1 - Symptomatic but completely ambulatory  Blood pressure 124/84, pulse 83, temperature 96.9 F (36.1 C), temperature source Oral, resp. rate 20, height 5\' 10"  (1.778 m), weight 183 lb 9.6 oz (83.28 kg).  LABORATORY DATA: Lab Results  Component Value Date   WBC 6.7 10/14/2013   HGB 10.5* 10/14/2013   HCT 31.5* 10/14/2013   MCV 96.6 10/14/2013   PLT 131* 10/14/2013      Chemistry      Component Value Date/Time   NA 139 10/14/2013 0915   NA 136 07/09/2013 1446   K 4.1 10/14/2013 0915   K 4.6 07/09/2013 1446   CL 102 07/09/2013 1446   CO2 24 10/14/2013 0915   CO2 27 07/09/2013 1446   BUN 16.6 10/14/2013 0915   BUN 17 07/09/2013 1446   CREATININE 0.8 10/14/2013 0915   CREATININE 1.0 07/09/2013 1446      Component Value Date/Time   CALCIUM 9.4 10/14/2013 0915   CALCIUM 9.8 07/09/2013 1446   ALKPHOS 65 10/14/2013 0915   ALKPHOS 62 07/09/2013 1446   AST 23 10/14/2013 0915   AST 24 07/09/2013 1446   ALT 25 10/14/2013 0915   ALT 22 07/09/2013 1446   BILITOT 0.54 10/14/2013 0915   BILITOT 0.8 07/09/2013 1446       RADIOGRAPHIC STUDIES: Ct Chest W Contrast  09/19/2013   CLINICAL DATA:  Lung cancer diagnosed 07/2013, with chemo and XRT  EXAM: CT CHEST, ABDOMEN, AND PELVIS WITH CONTRAST  TECHNIQUE: Multidetector CT imaging of the chest, abdomen and pelvis was performed following the standard protocol during bolus administration of intravenous contrast.  CONTRAST:  OMNIPAQUE IOHEXOL 300 MG/ML  SOLN  COMPARISON:  PET-CT dated 08/15/2013.  CT chest dated 07/12/2013.  FINDINGS: CT CHEST FINDINGS  Scattered bilateral pulmonary nodules/metastases. Dominant nodules include:  --11 x 11 mm right upper lobe  nodule (series 4/ image 21), previously 18 x 17 mm  --12 x 15 mm left lower lobe nodule (series 4/ image 45), previously 13 x 16 mm  --10 x 14 mm right lower lobe nodule (series 4/ image 46), previously 15 x 16 mm  Mild emphysematous changes. Scattered calcified granulomata. No pleural effusion or pneumothorax.  Visualized thyroid is unremarkable.  The heart is normal in size. No pericardial effusion. Mild atherosclerotic calcifications of the aortic arch.  Thoracic lymphadenopathy, including:  --13 mm short axis right hilar node (series 2/ image 29), previously 23 mm  --33 mm short axis subcarinal node (series 2/ image 34), previously 48 mm  --Additional calcified subcarinal nodes anteriorly, unchanged, benign  Mild subcutaneous stranding in the left upper back (series 2/image 4), without residual mass.  Degenerative changes of the thoracic spine.  CT ABDOMEN AND PELVIS FINDINGS  4 mm hypoenhancing lesion in the anterior right hepatic dome (series 2/image 53), not definitely visualized on the prior study.  Calcified splenic granulomata.  Pancreas and left adrenal gland are within normal limits.  1.6 x 1.8 cm right adrenal metastasis (series 2/ image 60), previously 2.4 x 3.3 cm.  Gallbladder is unremarkable. No intrahepatic or extrahepatic ductal dilatation.  Low mm posterior right upper pole renal cyst. Left kidney is  within normal limits. No hydronephrosis.  No evidence of bowel obstruction. Prior appendectomy. Sigmoid diverticulosis, without associated inflammatory changes.  Atherosclerotic calcifications of the abdominal aorta and branch vessels.  No abdominopelvic ascites.  4.6 x 2.7 cm gastrohepatic node (series 2/ image 59), previously 3.7 x 2.9 cm.  Prostate is unremarkable.  Bladder is within normal limits.  Degenerative changes of the lumbar spine.  IMPRESSION: CT CHEST IMPRESSION  Bilateral pulmonary nodules/metastases, measuring up to 15 mm, decreased.  Associated subcarinal/right hilar lymphadenopathy,  improved.  CT ABDOMEN AND PELVIS IMPRESSION  1.8 cm right adrenal metastasis, decreased.  4.6 cm gastrohepatic node, mildly increased.  4 mm hypoenhancing lesion in the anterior right hepatic dome, possibly new. Attention on follow-up suggested.   Electronically Signed   By: Charline Bills M.D.   On: 09/19/2013 08:29   Ct Abdomen Pelvis W Contrast  09/19/2013   CLINICAL DATA:  Lung cancer diagnosed 07/2013, with chemo and XRT  EXAM: CT CHEST, ABDOMEN, AND PELVIS WITH CONTRAST  TECHNIQUE: Multidetector CT imaging of the chest, abdomen and pelvis was performed following the standard protocol during bolus administration of intravenous contrast.  CONTRAST:  OMNIPAQUE IOHEXOL 300 MG/ML  SOLN  COMPARISON:  PET-CT dated 08/15/2013.  CT chest dated 07/12/2013.  FINDINGS: CT CHEST FINDINGS  Scattered bilateral pulmonary nodules/metastases. Dominant nodules include:  --11 x 11 mm right upper lobe nodule (series 4/ image 21), previously 18 x 17 mm  --12 x 15 mm left lower lobe nodule (series 4/ image 45), previously 13 x 16 mm  --10 x 14 mm right lower lobe nodule (series 4/ image 46), previously 15 x 16 mm  Mild emphysematous changes. Scattered calcified granulomata. No pleural effusion or pneumothorax.  Visualized thyroid is unremarkable.  The heart is normal in size. No pericardial effusion. Mild atherosclerotic calcifications of the aortic arch.  Thoracic lymphadenopathy, including:  --13 mm short axis right hilar node (series 2/ image 29), previously 23 mm  --33 mm short axis subcarinal node (series 2/ image 34), previously 48 mm  --Additional calcified subcarinal nodes anteriorly, unchanged, benign  Mild subcutaneous stranding in the left upper back (series 2/image 4), without residual mass.  Degenerative changes of the thoracic spine.  CT ABDOMEN AND PELVIS FINDINGS  4 mm hypoenhancing lesion in the anterior right hepatic dome (series 2/image 53), not definitely visualized on the prior study.  Calcified  splenic granulomata.  Pancreas and left adrenal gland are within normal limits.  1.6 x 1.8 cm right adrenal metastasis (series 2/ image 60), previously 2.4 x 3.3 cm.  Gallbladder is unremarkable. No intrahepatic or extrahepatic ductal dilatation.  Low mm posterior right upper pole renal cyst. Left kidney is within normal limits. No hydronephrosis.  No evidence of bowel obstruction. Prior appendectomy. Sigmoid diverticulosis, without associated inflammatory changes.  Atherosclerotic calcifications of the abdominal aorta and branch vessels.  No abdominopelvic ascites.  4.6 x 2.7 cm gastrohepatic node (series 2/ image 59), previously 3.7 x 2.9 cm.  Prostate is unremarkable.  Bladder is within normal limits.  Degenerative changes of the lumbar spine.  IMPRESSION: CT CHEST IMPRESSION  Bilateral pulmonary nodules/metastases, measuring up to 15 mm, decreased.  Associated subcarinal/right hilar lymphadenopathy, improved.  CT ABDOMEN AND PELVIS IMPRESSION  1.8 cm right adrenal metastasis, decreased.  4.6 cm gastrohepatic node, mildly increased.  4 mm hypoenhancing lesion in the anterior right hepatic dome, possibly new. Attention on follow-up suggested.   Electronically Signed   By: Charline Bills M.D.   On: 09/19/2013  08:29    ASSESSMENT AND PLAN: This is a very pleasant 52 years old white male with extensive stage small cell lung cancer status post palliative radiotherapy to the mediastinal lymphadenopathy as well as 3cycles of systemic chemotherapy with carboplatin and etoposide. Overall is tolerating his chemotherapy relatively well and his last restaging CT scan revealed significant improvement in his disease especially in the chest. The patient was discussed with him also seen by Dr. Arbutus Ped. He'll proceed with cycle #4 of his systemic chemotherapy with carboplatin and etoposide with Neulasta port as scheduled today. He'll continue with weekly labs consisting of a CBC differential and C. met. He will followup  with Dr. Arbutus Ped in 3 weeks with a restaging CT scan of the chest, abdomen and pelvis to reevaluate his disease.  Ray Burns, Haywood Meinders E, PA-C   He was advised to call immediately if he has any concerning symptoms in the interval.  The patient voices understanding of current disease status and treatment options and is in agreement with the current care plan.  All questions were answered. The patient knows to call the clinic with any problems, questions or concerns. We can certainly see the patient much sooner if necessary.  ADDENDUM: Hematology/Oncology Attending: I had the face to face encounter with the patient today. I recommended his care plan. This is a very pleasant 52 years old white male with extensive stage small cell lung cancer currently undergoing systemic chemotherapy with carboplatin and etoposide status post 3 cycles. He tolerated the last cycle of his treatment fairly well with no significant adverse effects. I recommended for the patient to proceed with cycle #4 today as scheduled. He would come back for followup visit in 3 weeks with repeat CT scan of the chest, abdomen and pelvis for restaging of his disease. The patient was advised to call immediately if he has any concerning symptoms in the interval. Lajuana Matte., MD 10/14/2013

## 2013-10-14 NOTE — Telephone Encounter (Signed)
Per staff message and POF I have scheduled appts.  JMW  

## 2013-10-15 ENCOUNTER — Ambulatory Visit (HOSPITAL_BASED_OUTPATIENT_CLINIC_OR_DEPARTMENT_OTHER): Payer: BC Managed Care – PPO

## 2013-10-15 VITALS — BP 127/66 | HR 91 | Temp 98.2°F | Resp 20

## 2013-10-15 DIAGNOSIS — Z5111 Encounter for antineoplastic chemotherapy: Secondary | ICD-10-CM

## 2013-10-15 DIAGNOSIS — C7800 Secondary malignant neoplasm of unspecified lung: Secondary | ICD-10-CM

## 2013-10-15 DIAGNOSIS — C3490 Malignant neoplasm of unspecified part of unspecified bronchus or lung: Secondary | ICD-10-CM

## 2013-10-15 DIAGNOSIS — C7970 Secondary malignant neoplasm of unspecified adrenal gland: Secondary | ICD-10-CM

## 2013-10-15 DIAGNOSIS — C3400 Malignant neoplasm of unspecified main bronchus: Secondary | ICD-10-CM

## 2013-10-15 MED ORDER — SODIUM CHLORIDE 0.9 % IV SOLN
120.0000 mg/m2 | Freq: Once | INTRAVENOUS | Status: AC
  Administered 2013-10-15: 240 mg via INTRAVENOUS
  Filled 2013-10-15: qty 12

## 2013-10-15 MED ORDER — ONDANSETRON 8 MG/50ML IVPB (CHCC)
8.0000 mg | Freq: Once | INTRAVENOUS | Status: AC
  Administered 2013-10-15: 8 mg via INTRAVENOUS

## 2013-10-15 MED ORDER — ONDANSETRON 16 MG/50ML IVPB (CHCC)
INTRAVENOUS | Status: AC
  Filled 2013-10-15: qty 16

## 2013-10-15 MED ORDER — DEXAMETHASONE SODIUM PHOSPHATE 10 MG/ML IJ SOLN
INTRAMUSCULAR | Status: AC
  Filled 2013-10-15: qty 1

## 2013-10-15 MED ORDER — SODIUM CHLORIDE 0.9 % IV SOLN
Freq: Once | INTRAVENOUS | Status: AC
  Administered 2013-10-15: 11:00:00 via INTRAVENOUS

## 2013-10-15 MED ORDER — DEXAMETHASONE SODIUM PHOSPHATE 10 MG/ML IJ SOLN
10.0000 mg | Freq: Once | INTRAMUSCULAR | Status: AC
  Administered 2013-10-15: 10 mg via INTRAVENOUS

## 2013-10-15 NOTE — Patient Instructions (Signed)
[facility] Discharge Instructions for Patients Receiving Chemotherapy  Today you received the following chemotherapy agents VP-16  To help prevent nausea and vomiting after your treatment, we encourage you to take your nausea medication     If you develop nausea and vomiting that is not controlled by your nausea medication, call the clinic.   BELOW ARE SYMPTOMS THAT SHOULD BE REPORTED IMMEDIATELY:  *FEVER GREATER THAN 100.5 F  *CHILLS WITH OR WITHOUT FEVER  NAUSEA AND VOMITING THAT IS NOT CONTROLLED WITH YOUR NAUSEA MEDICATION  *UNUSUAL SHORTNESS OF BREATH  *UNUSUAL BRUISING OR BLEEDING  TENDERNESS IN MOUTH AND THROAT WITH OR WITHOUT PRESENCE OF ULCERS  *URINARY PROBLEMS  *BOWEL PROBLEMS  UNUSUAL RASH Items with * indicate a potential emergency and should be followed up as soon as possible.  Feel free to call the clinic you have any questions or concerns. The clinic phone number is [phone].    

## 2013-10-16 ENCOUNTER — Ambulatory Visit (HOSPITAL_BASED_OUTPATIENT_CLINIC_OR_DEPARTMENT_OTHER): Payer: BC Managed Care – PPO

## 2013-10-16 ENCOUNTER — Encounter: Payer: Self-pay | Admitting: Internal Medicine

## 2013-10-16 ENCOUNTER — Ambulatory Visit: Payer: BC Managed Care – PPO | Admitting: Nutrition

## 2013-10-16 VITALS — BP 119/81 | HR 81 | Temp 97.5°F | Resp 18

## 2013-10-16 DIAGNOSIS — C7800 Secondary malignant neoplasm of unspecified lung: Secondary | ICD-10-CM

## 2013-10-16 DIAGNOSIS — C3490 Malignant neoplasm of unspecified part of unspecified bronchus or lung: Secondary | ICD-10-CM

## 2013-10-16 DIAGNOSIS — Z5111 Encounter for antineoplastic chemotherapy: Secondary | ICD-10-CM

## 2013-10-16 DIAGNOSIS — C3400 Malignant neoplasm of unspecified main bronchus: Secondary | ICD-10-CM

## 2013-10-16 DIAGNOSIS — C7970 Secondary malignant neoplasm of unspecified adrenal gland: Secondary | ICD-10-CM

## 2013-10-16 MED ORDER — ONDANSETRON 8 MG/NS 50 ML IVPB
INTRAVENOUS | Status: AC
  Filled 2013-10-16: qty 8

## 2013-10-16 MED ORDER — DEXAMETHASONE SODIUM PHOSPHATE 10 MG/ML IJ SOLN
INTRAMUSCULAR | Status: AC
  Filled 2013-10-16: qty 1

## 2013-10-16 MED ORDER — DEXAMETHASONE SODIUM PHOSPHATE 10 MG/ML IJ SOLN
10.0000 mg | Freq: Once | INTRAMUSCULAR | Status: AC
  Administered 2013-10-16: 10 mg via INTRAVENOUS

## 2013-10-16 MED ORDER — SODIUM CHLORIDE 0.9 % IV SOLN
Freq: Once | INTRAVENOUS | Status: AC
  Administered 2013-10-16: 10:00:00 via INTRAVENOUS

## 2013-10-16 MED ORDER — SODIUM CHLORIDE 0.9 % IV SOLN
120.0000 mg/m2 | Freq: Once | INTRAVENOUS | Status: AC
  Administered 2013-10-16: 240 mg via INTRAVENOUS
  Filled 2013-10-16: qty 12

## 2013-10-16 MED ORDER — ONDANSETRON 8 MG/50ML IVPB (CHCC)
8.0000 mg | Freq: Once | INTRAVENOUS | Status: AC
  Administered 2013-10-16: 8 mg via INTRAVENOUS

## 2013-10-16 NOTE — Progress Notes (Signed)
Patient came in and have copy of new card. I scanned in the system per Lenise. I didn't add it in as coverage yet per Lenise.

## 2013-10-16 NOTE — Progress Notes (Signed)
Patient feels well.  Weight has increased to 183.6 pounds October 27.  He denies any nutrition issues.  Nutrition diagnosis: Food and nutrition related knowledge deficit has improved.  Intervention: Patient to continue strategies for consuming a healthy diet with adequate calories and protein for weight maintenance.  Patient encouraged to follow a bowel regimen to avoid constipation.  Teach back method used.  Monitoring, evaluation, goals: Patient will continue to tolerate oral diet with adequate calories and protein for weight maintenance.  Next visit: Wednesday, November 19.

## 2013-10-16 NOTE — Patient Instructions (Signed)
[facility] Discharge Instructions for Patients Receiving Chemotherapy  Today you received the following chemotherapy agent: Etoposide   To help prevent nausea and vomiting after your treatment, we encourage you to take your nausea medication as prescribed.   If you develop nausea and vomiting that is not controlled by your nausea medication, call the clinic.   BELOW ARE SYMPTOMS THAT SHOULD BE REPORTED IMMEDIATELY:  *FEVER GREATER THAN 100.5 F  *CHILLS WITH OR WITHOUT FEVER  NAUSEA AND VOMITING THAT IS NOT CONTROLLED WITH YOUR NAUSEA MEDICATION  *UNUSUAL SHORTNESS OF BREATH  *UNUSUAL BRUISING OR BLEEDING  TENDERNESS IN MOUTH AND THROAT WITH OR WITHOUT PRESENCE OF ULCERS  *URINARY PROBLEMS  *BOWEL PROBLEMS  UNUSUAL RASH Items with * indicate a potential emergency and should be followed up as soon as possible.  Feel free to call the clinic you have any questions or concerns. The clinic phone number is [phone].    

## 2013-10-17 ENCOUNTER — Ambulatory Visit (HOSPITAL_BASED_OUTPATIENT_CLINIC_OR_DEPARTMENT_OTHER): Payer: BC Managed Care – PPO

## 2013-10-17 VITALS — BP 109/78 | HR 88 | Temp 97.4°F

## 2013-10-17 DIAGNOSIS — C7800 Secondary malignant neoplasm of unspecified lung: Secondary | ICD-10-CM

## 2013-10-17 DIAGNOSIS — C3400 Malignant neoplasm of unspecified main bronchus: Secondary | ICD-10-CM

## 2013-10-17 DIAGNOSIS — C3490 Malignant neoplasm of unspecified part of unspecified bronchus or lung: Secondary | ICD-10-CM

## 2013-10-17 DIAGNOSIS — Z5189 Encounter for other specified aftercare: Secondary | ICD-10-CM

## 2013-10-17 DIAGNOSIS — C7970 Secondary malignant neoplasm of unspecified adrenal gland: Secondary | ICD-10-CM

## 2013-10-17 MED ORDER — PEGFILGRASTIM INJECTION 6 MG/0.6ML
6.0000 mg | Freq: Once | SUBCUTANEOUS | Status: AC
  Administered 2013-10-17: 6 mg via SUBCUTANEOUS
  Filled 2013-10-17: qty 0.6

## 2013-10-21 ENCOUNTER — Other Ambulatory Visit (HOSPITAL_BASED_OUTPATIENT_CLINIC_OR_DEPARTMENT_OTHER): Payer: BC Managed Care – PPO

## 2013-10-21 DIAGNOSIS — C3400 Malignant neoplasm of unspecified main bronchus: Secondary | ICD-10-CM

## 2013-10-21 DIAGNOSIS — C3490 Malignant neoplasm of unspecified part of unspecified bronchus or lung: Secondary | ICD-10-CM

## 2013-10-21 LAB — CBC WITH DIFFERENTIAL/PLATELET
BASO%: 0.4 % (ref 0.0–2.0)
EOS%: 0.2 % (ref 0.0–7.0)
Eosinophils Absolute: 0 10*3/uL (ref 0.0–0.5)
MCH: 33.5 pg — ABNORMAL HIGH (ref 27.2–33.4)
MCHC: 33.8 g/dL (ref 32.0–36.0)
Platelets: 141 10*3/uL (ref 140–400)
RDW: 21.7 % — ABNORMAL HIGH (ref 11.0–14.6)
lymph#: 0.4 10*3/uL — ABNORMAL LOW (ref 0.9–3.3)

## 2013-10-21 LAB — COMPREHENSIVE METABOLIC PANEL (CC13)
ALT: 16 U/L (ref 0–55)
AST: 13 U/L (ref 5–34)
Albumin: 3.6 g/dL (ref 3.5–5.0)
Calcium: 9.7 mg/dL (ref 8.4–10.4)
Chloride: 105 mEq/L (ref 98–109)
Creatinine: 0.7 mg/dL (ref 0.7–1.3)
Potassium: 4.3 mEq/L (ref 3.5–5.1)
Total Protein: 6.7 g/dL (ref 6.4–8.3)

## 2013-10-28 ENCOUNTER — Other Ambulatory Visit (HOSPITAL_BASED_OUTPATIENT_CLINIC_OR_DEPARTMENT_OTHER): Payer: BC Managed Care – PPO

## 2013-10-28 ENCOUNTER — Telehealth: Payer: Self-pay | Admitting: Internal Medicine

## 2013-10-28 DIAGNOSIS — C3490 Malignant neoplasm of unspecified part of unspecified bronchus or lung: Secondary | ICD-10-CM

## 2013-10-28 DIAGNOSIS — C3400 Malignant neoplasm of unspecified main bronchus: Secondary | ICD-10-CM

## 2013-10-28 LAB — CBC WITH DIFFERENTIAL/PLATELET
Basophils Absolute: 0.1 10*3/uL (ref 0.0–0.1)
EOS%: 0.7 % (ref 0.0–7.0)
HGB: 9.3 g/dL — ABNORMAL LOW (ref 13.0–17.1)
LYMPH%: 8.2 % — ABNORMAL LOW (ref 14.0–49.0)
MCH: 34.4 pg — ABNORMAL HIGH (ref 27.2–33.4)
MCHC: 34.3 g/dL (ref 32.0–36.0)
MCV: 100 fL — ABNORMAL HIGH (ref 79.3–98.0)
MONO%: 8.9 % (ref 0.0–14.0)
Platelets: 44 10*3/uL — ABNORMAL LOW (ref 140–400)
RDW: 21 % — ABNORMAL HIGH (ref 11.0–14.6)
lymph#: 0.6 10*3/uL — ABNORMAL LOW (ref 0.9–3.3)

## 2013-10-28 LAB — COMPREHENSIVE METABOLIC PANEL (CC13)
AST: 17 U/L (ref 5–34)
Alkaline Phosphatase: 76 U/L (ref 40–150)
Anion Gap: 11 mEq/L (ref 3–11)
BUN: 14.4 mg/dL (ref 7.0–26.0)
CO2: 23 mEq/L (ref 22–29)
Glucose: 91 mg/dl (ref 70–140)
Sodium: 139 mEq/L (ref 136–145)
Total Bilirubin: 0.41 mg/dL (ref 0.20–1.20)

## 2013-10-28 NOTE — Telephone Encounter (Signed)
pt came by and picked up 2 bottles of barium to prep for 11/14 CT shh

## 2013-11-01 ENCOUNTER — Ambulatory Visit (HOSPITAL_COMMUNITY)
Admission: RE | Admit: 2013-11-01 | Discharge: 2013-11-01 | Disposition: A | Discharge status: Home / Self Care | Payer: BC Managed Care – PPO | Source: Ambulatory Visit | Attending: Physician Assistant | Admitting: Physician Assistant

## 2013-11-01 ENCOUNTER — Encounter (HOSPITAL_COMMUNITY): Payer: Self-pay

## 2013-11-01 DIAGNOSIS — C3490 Malignant neoplasm of unspecified part of unspecified bronchus or lung: Secondary | ICD-10-CM | POA: Insufficient documentation

## 2013-11-01 DIAGNOSIS — C778 Secondary and unspecified malignant neoplasm of lymph nodes of multiple regions: Secondary | ICD-10-CM | POA: Insufficient documentation

## 2013-11-01 DIAGNOSIS — C7970 Secondary malignant neoplasm of unspecified adrenal gland: Secondary | ICD-10-CM | POA: Insufficient documentation

## 2013-11-01 MED ORDER — IOHEXOL 300 MG/ML  SOLN
100.0000 mL | Freq: Once | INTRAMUSCULAR | Status: AC | PRN
  Administered 2013-11-01: 100 mL via INTRAVENOUS

## 2013-11-04 ENCOUNTER — Encounter: Payer: Self-pay | Admitting: Internal Medicine

## 2013-11-04 ENCOUNTER — Telehealth: Payer: Self-pay | Admitting: Internal Medicine

## 2013-11-04 ENCOUNTER — Other Ambulatory Visit (HOSPITAL_BASED_OUTPATIENT_CLINIC_OR_DEPARTMENT_OTHER): Payer: BC Managed Care – PPO | Admitting: Lab

## 2013-11-04 ENCOUNTER — Ambulatory Visit: Payer: BC Managed Care – PPO

## 2013-11-04 ENCOUNTER — Ambulatory Visit (HOSPITAL_BASED_OUTPATIENT_CLINIC_OR_DEPARTMENT_OTHER): Payer: BC Managed Care – PPO | Admitting: Internal Medicine

## 2013-11-04 VITALS — BP 128/78 | HR 99 | Temp 98.1°F | Resp 18 | Ht 70.0 in | Wt 187.0 lb

## 2013-11-04 DIAGNOSIS — C3400 Malignant neoplasm of unspecified main bronchus: Secondary | ICD-10-CM

## 2013-11-04 DIAGNOSIS — C7970 Secondary malignant neoplasm of unspecified adrenal gland: Secondary | ICD-10-CM

## 2013-11-04 DIAGNOSIS — C772 Secondary and unspecified malignant neoplasm of intra-abdominal lymph nodes: Secondary | ICD-10-CM

## 2013-11-04 DIAGNOSIS — C3491 Malignant neoplasm of unspecified part of right bronchus or lung: Secondary | ICD-10-CM

## 2013-11-04 DIAGNOSIS — C3490 Malignant neoplasm of unspecified part of unspecified bronchus or lung: Secondary | ICD-10-CM

## 2013-11-04 DIAGNOSIS — C7800 Secondary malignant neoplasm of unspecified lung: Secondary | ICD-10-CM

## 2013-11-04 LAB — CBC WITH DIFFERENTIAL/PLATELET
BASO%: 0.3 % (ref 0.0–2.0)
Eosinophils Absolute: 0 10*3/uL (ref 0.0–0.5)
HCT: 29.8 % — ABNORMAL LOW (ref 38.4–49.9)
HGB: 9.7 g/dL — ABNORMAL LOW (ref 13.0–17.1)
LYMPH%: 7.2 % — ABNORMAL LOW (ref 14.0–49.0)
MCHC: 32.6 g/dL (ref 32.0–36.0)
MONO#: 0.8 10*3/uL (ref 0.1–0.9)
NEUT#: 6.4 10*3/uL (ref 1.5–6.5)
NEUT%: 82.2 % — ABNORMAL HIGH (ref 39.0–75.0)
Platelets: 158 10*3/uL (ref 140–400)
RBC: 2.83 10*6/uL — ABNORMAL LOW (ref 4.20–5.82)
WBC: 7.7 10*3/uL (ref 4.0–10.3)
lymph#: 0.6 10*3/uL — ABNORMAL LOW (ref 0.9–3.3)
nRBC: 0 % (ref 0–0)

## 2013-11-04 NOTE — Telephone Encounter (Signed)
gave pt appt for lab and MD on Janury 2015, gave pt oral oral contrast for CT on January 2015, Pt will see Dr.Moody November 2014, gave pt oral contrast for CT

## 2013-11-04 NOTE — Progress Notes (Signed)
[facility] [phone]   [phone]  OFFICE PROGRESS NOTE  Ray Ora, Ray Burns [id number] [facility] [address] [place] [address]  DIAGNOSIS: Extensive stage small cell lung cancer diagnosed in August of 2014   PRIOR THERAPY:  1) Status post palliative radiotherapy to the mediastinal lymphadenopathy under the care of Dr. Mitzi Hansen. 2) Systemic chemotherapy with carboplatin for AUC of 5 on day 1 and etoposide at 120 mg/M2 on days 1, 2 and 3 with Neulasta support on day 4. First cycle given on 08/12/2013. He is status post 4 cycles discontinued today secondary to disease progression.   CURRENT THERAPY: None.  CHEMOTHERAPY INTENT: Palliative  CURRENT # OF CHEMOTHERAPY CYCLES: 4 CURRENT ANTIEMETICS: Zofran, dexamethasone and Compazine  CURRENT SMOKING STATUS: Quit smoking  ORAL CHEMOTHERAPY AND CONSENT: None  CURRENT BISPHOSPHONATES USE: None  PAIN MANAGEMENT: 0/10  NARCOTICS INDUCED CONSTIPATION: None  LIVING WILL AND CODE STATUS: Full code   INTERVAL HISTORY: Ray Burns 52 y.o. male returns to the clinic today for followup visit. The patient is doing fine today with no specific complaints. He tolerated the last cycle of his systemic chemotherapy fairly well except for mild fatigue.Marland Kitchen He denied having any significant weight loss or night sweats. The patient denied having any chest pain, shortness of breath, cough or hemoptysis. He has no fever or chills, no nausea or vomiting. He has repeat CT scan of the chest performed recently and he is here for evaluation and discussion of his scan results.  MEDICAL HISTORY: Past Medical History  Diagnosis Date  . GERD (gastroesophageal reflux disease)     mild  . Non-small cell lung cancer   . Hx of radiation therapy 08/05/13-08/26/13    chest/lung cancer/ 37.5Gy    ALLERGIES:  has No Known Allergies.  MEDICATIONS:  Current Outpatient Prescriptions  Medication Sig Dispense Refill  .  acetaminophen (TYLENOL) 500 MG tablet Take 500 mg by mouth every 6 (six) hours as needed for pain.      Marland Kitchen omeprazole (PRILOSEC OTC) 20 MG tablet Take 20 mg by mouth daily.      . prochlorperazine (COMPAZINE) 10 MG tablet Take 1 tablet (10 mg total) by mouth every 6 (six) hours as needed.  30 tablet  0  . sucralfate (CARAFATE) 1 G tablet Take 1 tablet (1 g total) by mouth 4 (four) times daily.  120 tablet  1   No current facility-administered medications for this visit.    SURGICAL HISTORY:  Past Surgical History  Procedure Laterality Date  . Appendectomy      52 y/o  . Video bronchoscopy Bilateral 07/25/2013    Procedure: VIDEO BRONCHOSCOPY WITHOUT FLUORO;  Surgeon: [name], Ray Burns;  Location: [facility];  Service: Cardiopulmonary;  Laterality: Bilateral;    REVIEW OF SYSTEMS:  Constitutional: negative Eyes: negative Ears, nose, mouth, throat, and face: negative Respiratory: negative Cardiovascular: negative Gastrointestinal: positive for odynophagia Genitourinary:negative Integument/breast: negative Hematologic/lymphatic: negative Musculoskeletal:negative Neurological: negative Behavioral/Psych: negative Endocrine: negative Allergic/Immunologic: negative   PHYSICAL EXAMINATION: General appearance: alert, cooperative and no distress Head: Normocephalic, without obvious abnormality, atraumatic Neck: no adenopathy, no JVD, supple, symmetrical, trachea midline and thyroid not enlarged, symmetric, no tenderness/mass/nodules Lymph nodes: Cervical, supraclavicular, and axillary nodes normal. Resp: clear to auscultation bilaterally Back: symmetric, no curvature. ROM normal. No CVA tenderness. Cardio: regular rate and rhythm, S1, S2 normal, no murmur, click, rub or gallop GI: soft, non-tender; bowel sounds normal; no masses,  no organomegaly  Extremities: extremities normal, atraumatic, no cyanosis or edema Neurologic: Alert and oriented X 3, normal strength and tone. Normal  symmetric reflexes. Normal coordination and gait  ECOG PERFORMANCE STATUS: 1 - Symptomatic but completely ambulatory  Blood pressure 128/78, pulse 99, temperature 98.1 F (36.7 C), temperature source Oral, resp. rate 18, height 5\' 10"  (1.778 m), weight 187 lb (84.823 kg).  LABORATORY DATA: Lab Results  Component Value Date   WBC 7.7 11/04/2013   HGB 9.7* 11/04/2013   HCT 29.8* 11/04/2013   MCV 105.3* 11/04/2013   PLT 158 11/04/2013      Chemistry      Component Value Date/Time   NA 139 10/28/2013 1009   NA 136 07/09/2013 1446   K 4.2 10/28/2013 1009   K 4.6 07/09/2013 1446   CL 102 07/09/2013 1446   CO2 23 10/28/2013 1009   CO2 27 07/09/2013 1446   BUN 14.4 10/28/2013 1009   BUN 17 07/09/2013 1446   CREATININE 0.8 10/28/2013 1009   CREATININE 1.0 07/09/2013 1446      Component Value Date/Time   CALCIUM 9.4 10/28/2013 1009   CALCIUM 9.8 07/09/2013 1446   ALKPHOS 76 10/28/2013 1009   ALKPHOS 62 07/09/2013 1446   AST 17 10/28/2013 1009   AST 24 07/09/2013 1446   ALT 18 10/28/2013 1009   ALT 22 07/09/2013 1446   BILITOT 0.41 10/28/2013 1009   BILITOT 0.8 07/09/2013 1446       RADIOGRAPHIC STUDIES: Ct Chest W Contrast  11/01/2013   CLINICAL DATA:  Restaging for small cell lung carcinoma.  EXAM: CT CHEST, ABDOMEN, AND PELVIS WITH CONTRAST  TECHNIQUE: Multidetector CT imaging of the chest, abdomen and pelvis was performed following the standard protocol during bolus administration of intravenous contrast.  CONTRAST:  OMNIPAQUE IOHEXOL 300 MG/ML  SOLN  COMPARISON:  09/19/2013  FINDINGS:   CT CHEST FINDINGS  Though partially calcified mediastinal lymphadenopathy in the subcarinal region is stable measuring 3.3 cm. Mild right hilar lymphadenopathy is also stable measuring 1.3 cm. No new or increased adenopathy seen within the thorax.  Bilateral pulmonary metastases are also stable. The index nodules in the right upper lobe measure 10 x 11 mm on image 20, in the right lower lobe  measure 10 x 14 mm on image 45, and in the left lower lobe measure 12 x 15 mm on image 45, all which are unchanged since previous study. No new or enlarging pulmonary nodules identified. No suspicious bone lesions identified.    CT ABDOMEN AND PELVIS FINDINGS  Lymphadenopathy in the gastrohepatic ligament is increased, currently measuring 3.9 x 5.1 cm on image 58 compared to 2.7 x 4.6 cm previously. No other sites of lymphadenopathy identified within the abdomen or pelvis.  A right adrenal metastasis has also increased in size, currently measuring 2.5 x 3.6 cm on image 61 compared to 1.8 x 1.6 cm previously.  Probable tiny hepatic cyst is stable, and no definite liver masses are identified. The pancreas, spleen, left adrenal gland, and kidneys are normal in appearance. No evidence of hydronephrosis. No pelvic masses identified.  No evidence of inflammatory process or abnormal fluid collections. Mild diverticulosis again noted, however there is no evidence of diverticulitis. No suspicious bone lesions identified.   IMPRESSION: Mild increase in size of upper abdominal metastatic lymphadenopathy and right adrenal metastasis.  Stable thoracic metastatic lymphadenopathy in bilateral pulmonary metastases.  No new sites of metastatic disease identified.   Electronically Signed   By: Alver Sorrow.D.  On: 11/01/2013 08:55    ASSESSMENT AND PLAN: This is a very pleasant 52 years old white male with extensive stage small cell lung cancer status post palliative radiotherapy to the mediastinal lymphadenopathy as well as 4 cycles of systemic chemotherapy with carboplatin and etoposide. His recent CT scan of the chest, abdomen and pelvis showed stable disease except for mild increase in the size of the upper abdomen metastatic lymphadenopathy and right adrenal metastasis. I discussed the scan results with the patient today. I recommended for him to see Dr. Mitzi Hansen for consideration of palliative radiotherapy to the  enlarging metastatic lymphadenopathy. I would discontinue his chemotherapy with carboplatin and etoposide at this point because of the disease progression. I would see the patient back for followup visit in 2 months for reevaluation with repeat CT scan of the chest, abdomen and pelvis for restaging of his disease. If he continues to have evidence for disease progression at that time I may consider the patient for second line chemotherapy with cisplatin and irinotecan. The patient agreed to the current plan. He was advised to call immediately if he has any concerning symptoms in the interval. The patient voices understanding of current disease status and treatment options and is in agreement with the current care plan.  All questions were answered. The patient knows to call the clinic with any problems, questions or concerns. We can certainly see the patient much sooner if necessary.  I spent 15 minutes counseling the patient face to face. The total time spent in the appointment was 25 minutes.

## 2013-11-04 NOTE — Patient Instructions (Signed)
CURRENT THERAPY: None.  CHEMOTHERAPY INTENT: Palliative  CURRENT # OF CHEMOTHERAPY CYCLES: 4  CURRENT ANTIEMETICS: Zofran, dexamethasone and Compazine  CURRENT SMOKING STATUS: Quit smoking  ORAL CHEMOTHERAPY AND CONSENT: None  CURRENT BISPHOSPHONATES USE: None  PAIN MANAGEMENT: 0/10  NARCOTICS INDUCED CONSTIPATION: None  LIVING WILL AND CODE STATUS: Full code

## 2013-11-05 ENCOUNTER — Ambulatory Visit: Payer: BC Managed Care – PPO

## 2013-11-05 NOTE — Progress Notes (Signed)
Thoracic Location of Tumor / Histology: B/l pulmonary metastases, Recon  Patient presented   months ago  With difficulty swallowing progressive   Biopsies of 07/25/13: Endobronchial bx ,bronchus itermedius-Small Cell Carcinoma  Tobacco/Marijuana/Snuff/ETOH use: quit smoking 2010  Past/Anticipated interventions by cardiothoracic surgery, if any:   Past/Anticipated interventions by medical oncology, if any: Carboplatin/etoposide with Neulasta support  1st cycle 08/12/13, chemotherapy stopped  Had only 4 cycles, Ct chest  01/01/14, f/u appt 01/02/14 Dr. Arbutus Ped Signs/Symptoms  Weight changes, if any: no  Respiratory complaints, if any: no  Hemoptysis, if any: no  Pain issues, if any:  no  SAFETY ISSUES: No   Prior radiation? Yes, Palliative 08/05/13-08/26/13 mediastinal lymphadenopathy  37.5cGy  Pacemaker/ICD? No,   Is the patient on methotrexate? no  Current Complaints / other details:  Mild odynophagia after last radiation tx

## 2013-11-06 ENCOUNTER — Ambulatory Visit
Admission: RE | Admit: 2013-11-06 | Discharge: 2013-11-06 | Disposition: A | Discharge status: Home / Self Care | Payer: BC Managed Care – PPO | Source: Ambulatory Visit | Attending: Radiation Oncology | Admitting: Radiation Oncology

## 2013-11-06 ENCOUNTER — Encounter: Payer: Self-pay | Admitting: Nutrition

## 2013-11-06 ENCOUNTER — Encounter: Payer: Self-pay | Admitting: Radiation Oncology

## 2013-11-06 ENCOUNTER — Ambulatory Visit: Payer: BC Managed Care – PPO

## 2013-11-06 VITALS — BP 149/89 | HR 87 | Temp 97.5°F | Resp 20 | Ht 70.0 in | Wt 183.0 lb

## 2013-11-06 DIAGNOSIS — C7970 Secondary malignant neoplasm of unspecified adrenal gland: Secondary | ICD-10-CM | POA: Insufficient documentation

## 2013-11-06 DIAGNOSIS — C3490 Malignant neoplasm of unspecified part of unspecified bronchus or lung: Secondary | ICD-10-CM

## 2013-11-06 DIAGNOSIS — C778 Secondary and unspecified malignant neoplasm of lymph nodes of multiple regions: Secondary | ICD-10-CM | POA: Insufficient documentation

## 2013-11-06 DIAGNOSIS — C772 Secondary and unspecified malignant neoplasm of intra-abdominal lymph nodes: Secondary | ICD-10-CM | POA: Insufficient documentation

## 2013-11-06 DIAGNOSIS — C3491 Malignant neoplasm of unspecified part of right bronchus or lung: Secondary | ICD-10-CM

## 2013-11-06 NOTE — Progress Notes (Signed)
Please see the Nurse Progress Note in the MD Initial Consult Encounter for this patient. 

## 2013-11-06 NOTE — Progress Notes (Signed)
Radiation Oncology         [phone] ________________________________  Name: Ray Burns MRN: [id number]  Date: 11/06/2013  DOB: [birth year]  Follow-Up Visit Note  CC: [name], MD  Si Gaul, MD  Diagnosis:   Extensive stage small cell lung cancer  Interval Since Last Radiation:  7 weeks   Narrative:  The patient returns today for routine follow-up.  The patient recently has undergone restaging studies after proceeding with chemotherapy through Dr. Shirline Frees. This largely showed stability/improvement of disease within the chest. There is an increase in upper abdominal metastatic lymphadenopathy as well as a right adrenal metastasis. This has been discussed with Dr. Arbutus Ped who has asked me to see the patient today to consider consolidative radiation treatment to the upper abdominal lymphadenopathy. The patient states that he is doing well overall. No abdominal pain. He indicates that his breathing has been fairly good.                          ALLERGIES:  has No Known Allergies.  Meds: Current Outpatient Prescriptions  Medication Sig Dispense Refill  . omeprazole (PRILOSEC OTC) 20 MG tablet Take 20 mg by mouth daily.      Marland Kitchen acetaminophen (TYLENOL) 500 MG tablet Take 500 mg by mouth every 6 (six) hours as needed for pain.      Marland Kitchen prochlorperazine (COMPAZINE) 10 MG tablet Take 1 tablet (10 mg total) by mouth every 6 (six) hours as needed.  30 tablet  0  . sucralfate (CARAFATE) 1 G tablet Take 1 tablet (1 g total) by mouth 4 (four) times daily.  120 tablet  1   No current facility-administered medications for this encounter.    Physical Findings: The patient is in no acute distress. Patient is alert and oriented.  height is 5\' 10"  (1.778 m) and weight is 183 lb (83.008 kg). His oral temperature is 97.5 F (36.4 C). His blood pressure is 149/89 and his pulse is 87. His respiration is 20 and oxygen saturation is 100%. .   General: Well-developed, in no acute distress HEENT:  Normocephalic, atraumatic Cardiovascular: Regular rate and rhythm Respiratory: Clear to auscultation bilaterally GI: Soft, nontender, normal bowel sounds Extremities: No edema present   Lab Findings: Lab Results  Component Value Date   WBC 7.7 11/04/2013   HGB 9.7* 11/04/2013   HCT 29.8* 11/04/2013   MCV 105.3* 11/04/2013   PLT 158 11/04/2013     Radiographic Findings: Ct Chest W Contrast  11/01/2013   CLINICAL DATA:  Restaging for small cell lung carcinoma.  EXAM: CT CHEST, ABDOMEN, AND PELVIS WITH CONTRAST  TECHNIQUE: Multidetector CT imaging of the chest, abdomen and pelvis was performed following the standard protocol during bolus administration of intravenous contrast.  CONTRAST:  OMNIPAQUE IOHEXOL 300 MG/ML  SOLN  COMPARISON:  09/19/2013  FINDINGS: CT CHEST FINDINGS  Though partially calcified mediastinal lymphadenopathy in the subcarinal region is stable measuring 3.3 cm. Mild right hilar lymphadenopathy is also stable measuring 1.3 cm. No new or increased adenopathy seen within the thorax.  Bilateral pulmonary metastases are also stable. The index nodules in the right upper lobe measure 10 x 11 mm on image 20, in the right lower lobe measure 10 x 14 mm on image 45, and in the left lower lobe measure 12 x 15 mm on image 45, all which are unchanged since previous study. No new or enlarging pulmonary nodules identified. No suspicious bone lesions identified.  CT ABDOMEN AND PELVIS FINDINGS  Lymphadenopathy in the gastrohepatic ligament is increased, currently measuring 3.9 x 5.1 cm on image 58 compared to 2.7 x 4.6 cm previously. No other sites of lymphadenopathy identified within the abdomen or pelvis.  A right adrenal metastasis has also increased in size, currently measuring 2.5 x 3.6 cm on image 61 compared to 1.8 x 1.6 cm previously.  Probable tiny hepatic cyst is stable, and no definite liver masses are identified. The pancreas, spleen, left adrenal gland, and kidneys are normal  in appearance. No evidence of hydronephrosis. No pelvic masses identified.  No evidence of inflammatory process or abnormal fluid collections. Mild diverticulosis again noted, however there is no evidence of diverticulitis. No suspicious bone lesions identified.  IMPRESSION: Mild increase in size of upper abdominal metastatic lymphadenopathy and right adrenal metastasis.  Stable thoracic metastatic lymphadenopathy in bilateral pulmonary metastases.  No new sites of metastatic disease identified.   Electronically Signed   By: Myles Rosenthal M.D.   On: 11/01/2013 08:55   Ct Abdomen Pelvis W Contrast  11/01/2013   CLINICAL DATA:  Restaging for small cell lung carcinoma.  EXAM: CT CHEST, ABDOMEN, AND PELVIS WITH CONTRAST  TECHNIQUE: Multidetector CT imaging of the chest, abdomen and pelvis was performed following the standard protocol during bolus administration of intravenous contrast.  CONTRAST:  OMNIPAQUE IOHEXOL 300 MG/ML  SOLN  COMPARISON:  09/19/2013  FINDINGS: CT CHEST FINDINGS  Though partially calcified mediastinal lymphadenopathy in the subcarinal region is stable measuring 3.3 cm. Mild right hilar lymphadenopathy is also stable measuring 1.3 cm. No new or increased adenopathy seen within the thorax.  Bilateral pulmonary metastases are also stable. The index nodules in the right upper lobe measure 10 x 11 mm on image 20, in the right lower lobe measure 10 x 14 mm on image 45, and in the left lower lobe measure 12 x 15 mm on image 45, all which are unchanged since previous study. No new or enlarging pulmonary nodules identified. No suspicious bone lesions identified.  CT ABDOMEN AND PELVIS FINDINGS  Lymphadenopathy in the gastrohepatic ligament is increased, currently measuring 3.9 x 5.1 cm on image 58 compared to 2.7 x 4.6 cm previously. No other sites of lymphadenopathy identified within the abdomen or pelvis.  A right adrenal metastasis has also increased in size, currently measuring 2.5 x 3.6 cm on  image 61 compared to 1.8 x 1.6 cm previously.  Probable tiny hepatic cyst is stable, and no definite liver masses are identified. The pancreas, spleen, left adrenal gland, and kidneys are normal in appearance. No evidence of hydronephrosis. No pelvic masses identified.  No evidence of inflammatory process or abnormal fluid collections. Mild diverticulosis again noted, however there is no evidence of diverticulitis. No suspicious bone lesions identified.  IMPRESSION: Mild increase in size of upper abdominal metastatic lymphadenopathy and right adrenal metastasis.  Stable thoracic metastatic lymphadenopathy in bilateral pulmonary metastases.  No new sites of metastatic disease identified.   Electronically Signed   By: Myles Rosenthal M.D.   On: 11/01/2013 08:55    Impression:    The patient is appropriate I believe to proceed with palliative radiation treatment to the lymph nodes in the upper abdomen which have increased in size. I discussed this with the patient in terms of the potential benefit from treatment as well as the side effects and risks. All of his questions were answered.  I also discussed with the patient possibly proceeding with a course of prophylactic cranial  irradiation. I discussed the rationale of this treatment as well as the possible side effects and risks of this treatment as well.  The patient is interested in proceeding with treatment to both of these areas.  Plan:  The patient will proceed with a simulation in the near future such that we can begin treatment planning. I anticipate a 2 week course of treatment both in terms of PCI as well as palliative radiation to the upper abdominal lymph nodes.    Radene Gunning, M.D., Ph.D.

## 2013-11-07 ENCOUNTER — Ambulatory Visit: Payer: BC Managed Care – PPO

## 2013-11-08 ENCOUNTER — Ambulatory Visit: Payer: BC Managed Care – PPO | Admitting: Radiation Oncology

## 2013-11-11 ENCOUNTER — Other Ambulatory Visit: Payer: BC Managed Care – PPO | Admitting: Lab

## 2013-11-11 ENCOUNTER — Ambulatory Visit
Admission: RE | Admit: 2013-11-11 | Discharge: 2013-11-11 | Disposition: A | Discharge status: Home / Self Care | Payer: BC Managed Care – PPO | Source: Ambulatory Visit | Attending: Radiation Oncology | Admitting: Radiation Oncology

## 2013-11-11 DIAGNOSIS — C772 Secondary and unspecified malignant neoplasm of intra-abdominal lymph nodes: Secondary | ICD-10-CM

## 2013-11-11 DIAGNOSIS — C3490 Malignant neoplasm of unspecified part of unspecified bronchus or lung: Secondary | ICD-10-CM | POA: Insufficient documentation

## 2013-11-11 DIAGNOSIS — Z510 Encounter for antineoplastic radiation therapy: Secondary | ICD-10-CM | POA: Diagnosis present

## 2013-11-18 DIAGNOSIS — Z510 Encounter for antineoplastic radiation therapy: Secondary | ICD-10-CM | POA: Diagnosis present

## 2013-11-18 DIAGNOSIS — C3490 Malignant neoplasm of unspecified part of unspecified bronchus or lung: Secondary | ICD-10-CM | POA: Diagnosis not present

## 2013-11-19 DIAGNOSIS — Z510 Encounter for antineoplastic radiation therapy: Secondary | ICD-10-CM | POA: Diagnosis not present

## 2013-11-20 ENCOUNTER — Ambulatory Visit
Admission: RE | Admit: 2013-11-20 | Discharge: 2013-11-20 | Disposition: A | Discharge status: Home / Self Care | Payer: BC Managed Care – PPO | Source: Ambulatory Visit | Attending: Radiation Oncology | Admitting: Radiation Oncology

## 2013-11-20 DIAGNOSIS — C772 Secondary and unspecified malignant neoplasm of intra-abdominal lymph nodes: Secondary | ICD-10-CM

## 2013-11-20 DIAGNOSIS — Z510 Encounter for antineoplastic radiation therapy: Secondary | ICD-10-CM | POA: Diagnosis not present

## 2013-11-21 ENCOUNTER — Encounter: Payer: Self-pay | Admitting: Radiation Oncology

## 2013-11-21 ENCOUNTER — Ambulatory Visit
Admission: RE | Admit: 2013-11-21 | Discharge: 2013-11-21 | Disposition: A | Discharge status: Home / Self Care | Payer: BC Managed Care – PPO | Source: Ambulatory Visit | Attending: Radiation Oncology | Admitting: Radiation Oncology

## 2013-11-21 VITALS — BP 150/91 | HR 83 | Temp 98.1°F | Resp 20 | Wt 184.9 lb

## 2013-11-21 DIAGNOSIS — Z510 Encounter for antineoplastic radiation therapy: Secondary | ICD-10-CM | POA: Diagnosis not present

## 2013-11-21 DIAGNOSIS — C772 Secondary and unspecified malignant neoplasm of intra-abdominal lymph nodes: Secondary | ICD-10-CM

## 2013-11-21 NOTE — Progress Notes (Signed)
   Department of Radiation Oncology  Phone:  [phone] Fax:        [phone]  Weekly Treatment Note    Name: Ray Burns Date: 11/21/2013 MRN: [id number] DOB: [birth year]   Current dose: 3 Gy  Current fraction: 1   MEDICATIONS: Current Outpatient Prescriptions  Medication Sig Dispense Refill  . acetaminophen (TYLENOL) 500 MG tablet Take 500 mg by mouth every 6 (six) hours as needed for pain.      Marland Kitchen omeprazole (PRILOSEC OTC) 20 MG tablet Take 20 mg by mouth daily.      . prochlorperazine (COMPAZINE) 10 MG tablet Take 1 tablet (10 mg total) by mouth every 6 (six) hours as needed.  30 tablet  0  . sucralfate (CARAFATE) 1 G tablet Take 1 tablet (1 g total) by mouth 4 (four) times daily.  120 tablet  1   No current facility-administered medications for this encounter.     ALLERGIES: Review of patient's allergies indicates no known allergies.   LABORATORY DATA:  Lab Results  Component Value Date   WBC 7.7 11/04/2013   HGB 9.7* 11/04/2013   HCT 29.8* 11/04/2013   MCV 105.3* 11/04/2013   PLT 158 11/04/2013   Lab Results  Component Value Date   NA 139 10/28/2013   K 4.2 10/28/2013   CL 102 07/09/2013   CO2 23 10/28/2013   Lab Results  Component Value Date   ALT 18 10/28/2013   AST 17 10/28/2013   ALKPHOS 76 10/28/2013   BILITOT 0.41 10/28/2013     NARRATIVE: Ray Burns was seen today for weekly treatment management. The chart was checked and the patient's films were reviewed. The patient is doing well. He had his first fraction today. He underwent port films yesterday and everything looked good.  PHYSICAL EXAMINATION: weight is 184 lb 14.4 oz (83.87 kg). His temperature is 98.1 F (36.7 C). His blood pressure is 150/91 and his pulse is 83. His respiration is 20.        ASSESSMENT: The patient is doing satisfactorily with treatment.  PLAN: We will continue with the patient's radiation treatment as planned.

## 2013-11-21 NOTE — Progress Notes (Signed)
Weekly rad txs brain and abd, 2/10 , no nausea, no h/a,blurred vision, reviewed patient education, declined biafine and rad book,"I still have the tube of biafine given me earlier that I haven't used ", no c/o pain or discomfort 9:08 AM

## 2013-11-22 ENCOUNTER — Ambulatory Visit
Admission: RE | Admit: 2013-11-22 | Discharge: 2013-11-22 | Disposition: A | Discharge status: Home / Self Care | Payer: BC Managed Care – PPO | Source: Ambulatory Visit | Attending: Radiation Oncology | Admitting: Radiation Oncology

## 2013-11-22 DIAGNOSIS — Z510 Encounter for antineoplastic radiation therapy: Secondary | ICD-10-CM | POA: Diagnosis not present

## 2013-11-25 ENCOUNTER — Ambulatory Visit
Admission: RE | Admit: 2013-11-25 | Discharge: 2013-11-25 | Disposition: A | Discharge status: Home / Self Care | Payer: BC Managed Care – PPO | Source: Ambulatory Visit | Attending: Radiation Oncology | Admitting: Radiation Oncology

## 2013-11-25 DIAGNOSIS — Z510 Encounter for antineoplastic radiation therapy: Secondary | ICD-10-CM | POA: Diagnosis not present

## 2013-11-26 ENCOUNTER — Ambulatory Visit
Admission: RE | Admit: 2013-11-26 | Discharge: 2013-11-26 | Disposition: A | Discharge status: Home / Self Care | Payer: BC Managed Care – PPO | Source: Ambulatory Visit | Attending: Radiation Oncology | Admitting: Radiation Oncology

## 2013-11-26 DIAGNOSIS — Z510 Encounter for antineoplastic radiation therapy: Secondary | ICD-10-CM | POA: Diagnosis not present

## 2013-11-27 ENCOUNTER — Ambulatory Visit
Admission: RE | Admit: 2013-11-27 | Discharge: 2013-11-27 | Disposition: A | Discharge status: Home / Self Care | Payer: BC Managed Care – PPO | Source: Ambulatory Visit | Attending: Radiation Oncology | Admitting: Radiation Oncology

## 2013-11-27 DIAGNOSIS — Z510 Encounter for antineoplastic radiation therapy: Secondary | ICD-10-CM | POA: Diagnosis not present

## 2013-11-28 ENCOUNTER — Ambulatory Visit
Admission: RE | Admit: 2013-11-28 | Discharge: 2013-11-28 | Disposition: A | Discharge status: Home / Self Care | Payer: BC Managed Care – PPO | Source: Ambulatory Visit | Attending: Radiation Oncology | Admitting: Radiation Oncology

## 2013-11-28 DIAGNOSIS — Z510 Encounter for antineoplastic radiation therapy: Secondary | ICD-10-CM | POA: Diagnosis not present

## 2013-11-29 ENCOUNTER — Ambulatory Visit
Admission: RE | Admit: 2013-11-29 | Discharge: 2013-11-29 | Disposition: A | Discharge status: Home / Self Care | Payer: BC Managed Care – PPO | Source: Ambulatory Visit | Attending: Radiation Oncology | Admitting: Radiation Oncology

## 2013-11-29 VITALS — BP 124/81 | HR 82 | Temp 98.1°F | Ht 70.0 in | Wt 184.7 lb

## 2013-11-29 DIAGNOSIS — C772 Secondary and unspecified malignant neoplasm of intra-abdominal lymph nodes: Secondary | ICD-10-CM

## 2013-11-29 DIAGNOSIS — Z510 Encounter for antineoplastic radiation therapy: Secondary | ICD-10-CM | POA: Diagnosis not present

## 2013-11-29 NOTE — Progress Notes (Signed)
   Department of Radiation Oncology  Phone:  [phone] Fax:        [phone]  Weekly Treatment Note    Name: Ray Burns Date: 11/29/2013 MRN: [id number] DOB: 05/30/1961   Current dose: 21 Gy  Current fraction: 7   MEDICATIONS: Current Outpatient Prescriptions  Medication Sig Dispense Refill  . acetaminophen (TYLENOL) 500 MG tablet Take 500 mg by mouth every 6 (six) hours as needed for pain.      Marland Kitchen omeprazole (PRILOSEC OTC) 20 MG tablet Take 20 mg by mouth daily.      . prochlorperazine (COMPAZINE) 10 MG tablet Take 1 tablet (10 mg total) by mouth every 6 (six) hours as needed.  30 tablet  0  . sucralfate (CARAFATE) 1 G tablet Take 1 tablet (1 g total) by mouth 4 (four) times daily.  120 tablet  1   No current facility-administered medications for this encounter.     ALLERGIES: Review of patient's allergies indicates no known allergies.   LABORATORY DATA:  Lab Results  Component Value Date   WBC 7.7 11/04/2013   HGB 9.7* 11/04/2013   HCT 29.8* 11/04/2013   MCV 105.3* 11/04/2013   PLT 158 11/04/2013   Lab Results  Component Value Date   NA 139 10/28/2013   K 4.2 10/28/2013   CL 102 07/09/2013   CO2 23 10/28/2013   Lab Results  Component Value Date   ALT 18 10/28/2013   AST 17 10/28/2013   ALKPHOS 76 10/28/2013   BILITOT 0.41 10/28/2013     NARRATIVE: Ray Burns was seen today for weekly treatment management. The chart was checked and the patient's films were reviewed. The patient is doing well. He has had some low-grade nausea. Otherwise no complaints.  PHYSICAL EXAMINATION: height is 5\' 10"  (1.778 m) and weight is 184 lb 11.2 oz (83.779 kg). His temperature is 98.1 F (36.7 C). His blood pressure is 124/81 and his pulse is 82. His oxygen saturation is 99%.        ASSESSMENT: The patient is doing satisfactorily with treatment.   PLAN: We will continue with the patient's radiation treatment as planned. The patient will begin using Compazine prior  to treatment each day. We will see him for followup in one month.

## 2013-11-29 NOTE — Progress Notes (Signed)
Ray Burns has had 7 fractions to his brain and abdomen.  He denies pain.  He does have nausea mostly after treatment.  Advised him to take compazine before his treatments.  He denies dizziness, blurred vision and balance.  He reports his skin is intact in the treatment areas.

## 2013-12-02 ENCOUNTER — Ambulatory Visit
Admission: RE | Admit: 2013-12-02 | Discharge: 2013-12-02 | Disposition: A | Discharge status: Home / Self Care | Payer: BC Managed Care – PPO | Source: Ambulatory Visit | Attending: Radiation Oncology | Admitting: Radiation Oncology

## 2013-12-02 DIAGNOSIS — Z510 Encounter for antineoplastic radiation therapy: Secondary | ICD-10-CM | POA: Diagnosis not present

## 2013-12-03 ENCOUNTER — Ambulatory Visit
Admission: RE | Admit: 2013-12-03 | Discharge: 2013-12-03 | Disposition: A | Discharge status: Home / Self Care | Payer: BC Managed Care – PPO | Source: Ambulatory Visit | Attending: Radiation Oncology | Admitting: Radiation Oncology

## 2013-12-03 DIAGNOSIS — Z510 Encounter for antineoplastic radiation therapy: Secondary | ICD-10-CM | POA: Diagnosis not present

## 2013-12-04 ENCOUNTER — Encounter: Payer: Self-pay | Admitting: Radiation Oncology

## 2013-12-04 ENCOUNTER — Ambulatory Visit
Admission: RE | Admit: 2013-12-04 | Discharge: 2013-12-04 | Disposition: A | Discharge status: Home / Self Care | Payer: BC Managed Care – PPO | Source: Ambulatory Visit | Attending: Radiation Oncology | Admitting: Radiation Oncology

## 2013-12-04 DIAGNOSIS — Z510 Encounter for antineoplastic radiation therapy: Secondary | ICD-10-CM | POA: Diagnosis not present

## 2013-12-09 NOTE — Addendum Note (Signed)
Encounter addended by: Jonna Coup, MD on: 12/09/2013  9:30 PM<BR>     Documentation filed: Visit Diagnoses, Notes Section

## 2013-12-09 NOTE — Addendum Note (Signed)
Encounter addended by: Jonna Coup, MD on: 12/09/2013  9:29 PM<BR>     Documentation filed: Notes Section, Visit Diagnoses, Follow-up Section, LOS Section

## 2013-12-09 NOTE — Progress Notes (Signed)
  Radiation Oncology         [phone] ________________________________  Name: Ray Burns MRN: [id number]  Date: 11/11/2013  DOB: 07/31/1961  SIMULATION AND TREATMENT PLANNING NOTE  DIAGNOSIS:  Metastatic lung cancer, small cell  NARRATIVE:  The patient was brought to the CT Simulation planning suite.  Identity was confirmed.  The patient will receive treatment to 2 separate target areas: Palliative abdominal radiation treatment as well as a course of prophylactic cranial irradiation. These areas will be treated concurrently. All relevant records and images related to the planned course of therapy were reviewed.   Written consent to proceed with treatment was confirmed which was freely given after reviewing the details related to the planned course of therapy had been reviewed with the patient.  Then, the patient was set-up in a stable reproducible  supine position for radiation therapy.  CT images were obtained.  Surface markings were placed.   A total of 2 customized treatment devices were constructed to help with the patient immobilization: A customized thermoplastic head chest for immobilization of the head and neck region, and a customized vac-lock bag for immobilization of the abdominal region. Each of these 2 customized treatment devices will be used each fraction.   The CT images were loaded into the planning software.  Then the target and avoidance structures were contoured.  Treatment planning then occurred.  The radiation prescription was entered and confirmed.  A total of 6 complex treatment devices were fabricated which relate to the designed radiation treatment fields: 2 customized fields for prophylactic cranial irradiation, and a 4 field technique for treatment to the gross disease within the abdomen. Each of these customized fields/ complex treatment devices will be used on a daily basis during the radiation course. I have requested : 3D Simulation  I have requested a DVH of the  following structures: Gross tumor volume, spinal cord, kidneys bilaterally, liver.   PLAN:  The patient will receive 30 Gy in 10 fractions to both of these treatment areas..  ________________________________   Radene Gunning, MD, PhD

## 2013-12-09 NOTE — Progress Notes (Signed)
  Radiation Oncology         [phone] ________________________________  Name: Ray Burns MRN: [id number]  Date: 11/20/2013  DOB: Jun 20, 1961  Simulation Verification Note   NARRATIVE: The patient was brought to the treatment unit and placed in the planned treatment position for treatment to 2 areas: Whole brain radiation as well as palliative radiation to the abdomen. The clinical setup was verified. Then port films were obtained and uploaded to the radiation oncology medical record software.  The treatment beams were carefully compared against the planned radiation fields. The position, location, and shape of the radiation fields was reviewed. The targeted volume of tissue appears to be appropriately covered by the radiation beams. Based on my personal review, I approved the simulation verification. The patient's treatment will proceed as planned.  ________________________________   Radene Gunning, MD, PhD

## 2013-12-09 NOTE — Progress Notes (Signed)
  Radiation Oncology         [phone] ________________________________  Name: Ray Burns MRN: [id number]  Date: 12/04/2013  DOB: [birth year]  End of Treatment Note  Diagnosis:   Extensive stage small cell lung cancer     Indication for treatment:  Palliative       Radiation treatment dates:   11/21/2013 through 12/04/2013  Site/dose:    1.  Prophylactic cranial irradiation to 25 gray in 10 fractions. 2.  palliative abdominal radiation to 30 gray in 10 fractions at 3 gray per fraction using a 4 field 3-D conformal technique. Daily image guidance was used during this course of treatment.   Narrative: The patient tolerated radiation treatment relatively well.   The patient had some mild nausea/decreased appetite. Otherwise did excellent.   Plan: The patient has completed radiation treatment. The patient will return to radiation oncology clinic for routine followup in one month. I advised the patient to call or return sooner if they have any questions or concerns related to their recovery or treatment. ________________________________  Radene Gunning, M.D., Ph.D.

## 2013-12-26 ENCOUNTER — Ambulatory Visit: Payer: Self-pay | Admitting: Radiation Oncology

## 2013-12-26 ENCOUNTER — Telehealth: Payer: Self-pay | Admitting: *Deleted

## 2013-12-26 ENCOUNTER — Telehealth: Payer: Self-pay | Admitting: Internal Medicine

## 2013-12-26 NOTE — Telephone Encounter (Signed)
Patient Information:  Caller Name: [name]  Phone: [phone]  Patient: Ray Burns, Ray Burns  Gender: Male  DOB: 08/29/1961  Age: 53 Years  PCP: Kathlene November  Office Follow Up:  Does the office need to follow up with this patient?: Yes  Instructions For The Office: Per Shanon Brow office will call pt back.  RN Note:  Per disposition contacted the office and spoke with Shanon Brow, Dr Larose Kells assistant and advised he would speak with Dr Larose Kells and return call to pt asap.  Symptoms  Reason For Call & Symptoms: Pt's wife states pt has unbearable lower left sided back pain above the belt line.  Pain  8 on pain scale.  Pt states call to [facility] and advised to call PCP to be seen today.  Reviewed Health History In EMR: Yes  Reviewed Medications In EMR: Yes  Reviewed Allergies In EMR: Yes  Reviewed Surgeries / Procedures: Yes  Date of Onset of Symptoms: 12/12/2013  Guideline(s) Used:  Back Pain  Disposition Per Guideline:   Go to Office Now  Reason For Disposition Reached:   Severe back pain  Advice Given:  Call Back If:  You become worse.  Patient Will Follow Care Advice:  YES

## 2013-12-26 NOTE — Telephone Encounter (Signed)
Call a nurse is calling regards to pt complaining of severe stabbing pain above belt line mostly on left side , states he was dx with lung cancer and pt called cancer center was told to follow-up with PCP. Pt does have physical apt tomorrow but would like to be seen today. Please advise.

## 2013-12-26 NOTE — Telephone Encounter (Signed)
If we don't have the capability to see him today rec : UC @ [facility] or ER in the [facility] in [place]

## 2013-12-26 NOTE — Telephone Encounter (Signed)
Pt notified of recommendations , states pain only hurts in certain positions not constant and will wait until tomorrow to be seen by Dr. Larose Kells.

## 2013-12-26 NOTE — Telephone Encounter (Deleted)
Call a nurse is calling regards to pt complaining of severe stabbing pain above belt line mostly on left side , states he was dx with lung cancer and pt called cancer center was told to follow-up with PCP.

## 2013-12-26 NOTE — Telephone Encounter (Signed)
Pt's wife called stating that pt has "increased pain and swelling in his side, he thinks it may be his kidney".  Pt is on observation with Dr Vista Mink, per Dr Vista Mink, pt needs to see PCP. Left vm with instructions SLJ

## 2013-12-27 ENCOUNTER — Encounter: Payer: Self-pay | Admitting: Internal Medicine

## 2013-12-27 ENCOUNTER — Ambulatory Visit: Payer: Self-pay | Admitting: Internal Medicine

## 2013-12-27 NOTE — Progress Notes (Signed)
Unable to get neulasta replaced; the patient had bcbs [place] effective 05/19/13-11/17/13.

## 2014-01-01 ENCOUNTER — Other Ambulatory Visit (HOSPITAL_BASED_OUTPATIENT_CLINIC_OR_DEPARTMENT_OTHER): Payer: BC Managed Care – PPO

## 2014-01-01 ENCOUNTER — Encounter (INDEPENDENT_AMBULATORY_CARE_PROVIDER_SITE_OTHER): Payer: Self-pay

## 2014-01-01 ENCOUNTER — Encounter (HOSPITAL_COMMUNITY): Payer: Self-pay

## 2014-01-01 ENCOUNTER — Ambulatory Visit (HOSPITAL_COMMUNITY)
Admission: RE | Admit: 2014-01-01 | Discharge: 2014-01-01 | Disposition: A | Discharge status: Home / Self Care | Payer: BC Managed Care – PPO | Source: Ambulatory Visit | Attending: Internal Medicine | Admitting: Internal Medicine

## 2014-01-01 DIAGNOSIS — J8410 Pulmonary fibrosis, unspecified: Secondary | ICD-10-CM | POA: Insufficient documentation

## 2014-01-01 DIAGNOSIS — C3490 Malignant neoplasm of unspecified part of unspecified bronchus or lung: Secondary | ICD-10-CM | POA: Insufficient documentation

## 2014-01-01 DIAGNOSIS — C3491 Malignant neoplasm of unspecified part of right bronchus or lung: Secondary | ICD-10-CM

## 2014-01-01 DIAGNOSIS — C3400 Malignant neoplasm of unspecified main bronchus: Secondary | ICD-10-CM

## 2014-01-01 DIAGNOSIS — C7800 Secondary malignant neoplasm of unspecified lung: Secondary | ICD-10-CM

## 2014-01-01 DIAGNOSIS — N281 Cyst of kidney, acquired: Secondary | ICD-10-CM | POA: Insufficient documentation

## 2014-01-01 DIAGNOSIS — C7970 Secondary malignant neoplasm of unspecified adrenal gland: Secondary | ICD-10-CM

## 2014-01-01 DIAGNOSIS — C787 Secondary malignant neoplasm of liver and intrahepatic bile duct: Secondary | ICD-10-CM | POA: Insufficient documentation

## 2014-01-01 DIAGNOSIS — R599 Enlarged lymph nodes, unspecified: Secondary | ICD-10-CM | POA: Insufficient documentation

## 2014-01-01 LAB — CBC WITH DIFFERENTIAL/PLATELET
BASO%: 0.5 % (ref 0.0–2.0)
BASOS ABS: 0 10*3/uL (ref 0.0–0.1)
EOS ABS: 0.2 10*3/uL (ref 0.0–0.5)
EOS%: 3.1 % (ref 0.0–7.0)
HCT: 44.3 % (ref 38.4–49.9)
HGB: 15 g/dL (ref 13.0–17.1)
LYMPH#: 0.8 10*3/uL — AB (ref 0.9–3.3)
LYMPH%: 15.6 % (ref 14.0–49.0)
MCH: 33 pg (ref 27.2–33.4)
MCHC: 33.9 g/dL (ref 32.0–36.0)
MCV: 97.4 fL (ref 79.3–98.0)
MONO#: 0.8 10*3/uL (ref 0.1–0.9)
MONO%: 14.7 % — ABNORMAL HIGH (ref 0.0–14.0)
NEUT%: 66.1 % (ref 39.0–75.0)
NEUTROS ABS: 3.4 10*3/uL (ref 1.5–6.5)
Platelets: 162 10*3/uL (ref 140–400)
RBC: 4.54 10*6/uL (ref 4.20–5.82)
RDW: 12.7 % (ref 11.0–14.6)
WBC: 5.2 10*3/uL (ref 4.0–10.3)

## 2014-01-01 LAB — COMPREHENSIVE METABOLIC PANEL (CC13)
ALT: 28 U/L (ref 0–55)
ANION GAP: 13 meq/L — AB (ref 3–11)
AST: 28 U/L (ref 5–34)
Albumin: 3.8 g/dL (ref 3.5–5.0)
Alkaline Phosphatase: 69 U/L (ref 40–150)
BUN: 20.8 mg/dL (ref 7.0–26.0)
CALCIUM: 9.8 mg/dL (ref 8.4–10.4)
CHLORIDE: 103 meq/L (ref 98–109)
CO2: 24 mEq/L (ref 22–29)
Creatinine: 1 mg/dL (ref 0.7–1.3)
Glucose: 116 mg/dl (ref 70–140)
Potassium: 4.2 mEq/L (ref 3.5–5.1)
Sodium: 140 mEq/L (ref 136–145)
Total Bilirubin: 0.34 mg/dL (ref 0.20–1.20)
Total Protein: 7.4 g/dL (ref 6.4–8.3)

## 2014-01-01 MED ORDER — IOHEXOL 300 MG/ML  SOLN
100.0000 mL | Freq: Once | INTRAMUSCULAR | Status: AC | PRN
  Administered 2014-01-01: 100 mL via INTRAVENOUS

## 2014-01-02 ENCOUNTER — Telehealth: Payer: Self-pay | Admitting: Internal Medicine

## 2014-01-02 ENCOUNTER — Ambulatory Visit (HOSPITAL_BASED_OUTPATIENT_CLINIC_OR_DEPARTMENT_OTHER): Payer: BC Managed Care – PPO | Admitting: Internal Medicine

## 2014-01-02 ENCOUNTER — Encounter: Payer: Self-pay | Admitting: Internal Medicine

## 2014-01-02 VITALS — BP 128/89 | HR 94 | Temp 97.7°F | Resp 18 | Ht 70.0 in | Wt 181.3 lb

## 2014-01-02 DIAGNOSIS — C3400 Malignant neoplasm of unspecified main bronchus: Secondary | ICD-10-CM

## 2014-01-02 DIAGNOSIS — C787 Secondary malignant neoplasm of liver and intrahepatic bile duct: Secondary | ICD-10-CM

## 2014-01-02 DIAGNOSIS — C7800 Secondary malignant neoplasm of unspecified lung: Secondary | ICD-10-CM

## 2014-01-02 DIAGNOSIS — C7970 Secondary malignant neoplasm of unspecified adrenal gland: Secondary | ICD-10-CM

## 2014-01-02 DIAGNOSIS — C3490 Malignant neoplasm of unspecified part of unspecified bronchus or lung: Secondary | ICD-10-CM

## 2014-01-02 NOTE — Telephone Encounter (Signed)
gv and printed papt sched and avs for pt for Jan...gv referral to [facility]

## 2014-01-02 NOTE — Progress Notes (Signed)
[facility] [phone]   Fax:([phone]) [place], [place] [facility] [place] [address]  DIAGNOSIS: Extensive stage small cell lung cancer diagnosed in August of 2014   PRIOR THERAPY:  1) Status post palliative radiotherapy to the mediastinal lymphadenopathy under the care of Dr. Lisbeth Renshaw. 2) Systemic chemotherapy with carboplatin for AUC of 5 on day 1 and etoposide at 120 mg/M2 on days 1, 2 and 3 with Neulasta support on day 4. First cycle given on 08/12/2013. He is status post 4 cycles discontinued today secondary to disease progression.  3) prophylactic cranial irradiation completed 12/04/2013 4) palliative radiotherapy to the enlarging upper abdominal metastatic lymphadenopathy as well as the right adrenal metastasis under the care of Dr. Lisbeth Renshaw completed 12/04/2013  CURRENT THERAPY: None.  CHEMOTHERAPY INTENT: Palliative  CURRENT # OF CHEMOTHERAPY CYCLES: 0 CURRENT ANTIEMETICS: Zofran, dexamethasone and Compazine  CURRENT SMOKING STATUS: Quit smoking  ORAL CHEMOTHERAPY AND CONSENT: None  CURRENT BISPHOSPHONATES USE: None  PAIN MANAGEMENT: 0/10  NARCOTICS INDUCED CONSTIPATION: None  LIVING WILL AND CODE STATUS: Full code   INTERVAL HISTORY: Ray Burns 53 y.o. male returns to the clinic today for followup visit. The patient is doing fine today with no specific complaints except for mild fatigue. He tolerated the last course of palliative radiotherapy fairly well. He denied having any significant weight loss or night sweats. The patient denied having any chest pain, shortness of breath, cough or hemoptysis. He has no fever or chills, no nausea or vomiting. He has repeat CT scan of the chest performed recently and he is here for evaluation and discussion of his scan results.  MEDICAL HISTORY: Past Medical History  Diagnosis Date  . GERD (gastroesophageal reflux disease)     mild  . Non-small  cell lung cancer   . Hx of radiation therapy 08/05/13-08/26/13    chest/lung cancer/ 37.5Gy    ALLERGIES:  has No Known Allergies.  MEDICATIONS:  Current Outpatient Prescriptions  Medication Sig Dispense Refill  . acetaminophen (TYLENOL) 500 MG tablet Take 500 mg by mouth every 6 (six) hours as needed for pain.      Marland Kitchen omeprazole (PRILOSEC OTC) 20 MG tablet Take 20 mg by mouth daily.       No current facility-administered medications for this visit.    SURGICAL HISTORY:  Past Surgical History  Procedure Laterality Date  . Appendectomy      53 y/o  . Video bronchoscopy Bilateral 07/25/2013    Procedure: VIDEO BRONCHOSCOPY WITHOUT FLUORO;  Surgeon: Tanda Rockers, MD;  Location: [facility];  Service: Cardiopulmonary;  Laterality: Bilateral;    REVIEW OF SYSTEMS:  Constitutional: negative Eyes: negative Ears, nose, mouth, throat, and face: negative Respiratory: negative Cardiovascular: negative Gastrointestinal: positive for odynophagia Genitourinary:negative Integument/breast: negative Hematologic/lymphatic: negative Musculoskeletal:negative Neurological: negative Behavioral/Psych: negative Endocrine: negative Allergic/Immunologic: negative   PHYSICAL EXAMINATION: General appearance: alert, cooperative and no distress Head: Normocephalic, without obvious abnormality, atraumatic Neck: no adenopathy, no JVD, supple, symmetrical, trachea midline and thyroid not enlarged, symmetric, no tenderness/mass/nodules Lymph nodes: Cervical, supraclavicular, and axillary nodes normal. Resp: clear to auscultation bilaterally Back: symmetric, no curvature. ROM normal. No CVA tenderness. Cardio: regular rate and rhythm, S1, S2 normal, no murmur, click, rub or gallop GI: soft, non-tender; bowel sounds normal; no masses,  no organomegaly Extremities: extremities normal, atraumatic, no cyanosis or edema Neurologic: Alert and oriented X 3, normal strength and tone. Normal symmetric  reflexes.  Normal coordination and gait  ECOG PERFORMANCE STATUS: 1 - Symptomatic but completely ambulatory  Blood pressure 128/89, pulse 94, temperature 97.7 F (36.5 C), temperature source Oral, resp. rate 18, height 5\' 10"  (1.778 m), weight 181 lb 4.8 oz (82.237 kg), SpO2 98.00%.  LABORATORY DATA: Lab Results  Component Value Date   WBC 5.2 01/01/2014   HGB 15.0 01/01/2014   HCT 44.3 01/01/2014   MCV 97.4 01/01/2014   PLT 162 01/01/2014      Chemistry      Component Value Date/Time   NA 140 01/01/2014 0759   NA 136 07/09/2013 1446   K 4.2 01/01/2014 0759   K 4.6 07/09/2013 1446   CL 102 07/09/2013 1446   CO2 24 01/01/2014 0759   CO2 27 07/09/2013 1446   BUN 20.8 01/01/2014 0759   BUN 17 07/09/2013 1446   CREATININE 1.0 01/01/2014 0759   CREATININE 1.0 07/09/2013 1446      Component Value Date/Time   CALCIUM 9.8 01/01/2014 0759   CALCIUM 9.8 07/09/2013 1446   ALKPHOS 69 01/01/2014 0759   ALKPHOS 62 07/09/2013 1446   AST 28 01/01/2014 0759   AST 24 07/09/2013 1446   ALT 28 01/01/2014 0759   ALT 22 07/09/2013 1446   BILITOT 0.34 01/01/2014 0759   BILITOT 0.8 07/09/2013 1446       RADIOGRAPHIC STUDIES: Ct Chest W Contrast  01/01/2014   CLINICAL DATA:  Small cell lung cancer.  EXAM: CT CHEST, ABDOMEN, AND PELVIS WITH CONTRAST  TECHNIQUE: Multidetector CT imaging of the chest, abdomen and pelvis was performed following the standard protocol during bolus administration of intravenous contrast.  CONTRAST:  165mL OMNIPAQUE IOHEXOL 300 MG/ML  SOLN  COMPARISON:  11/01/2013  FINDINGS:   CT CHEST FINDINGS  There is no axillary lymphadenopathy. Partially calcified subcarinal lymphadenopathy is decreased in the interval, measuring 2.9 cm today in the same dimension in which it measured 3.3 cm previously. Despite this decrease in the subcarinal lymph nodes, left hilar lymphadenopathy is progressed with a 1.6 cm lymph node now identified where lymphoid tissue measured only 0.4 cm previously. The right hilum is  stable.  Heart size is normal. No pericardial effusion. No evidence for pleural effusion.  Lung windows show interval progression of pulmonary metastases. 2.3 cm central right upper lobe nodule on image 21 measured 1.0 cm on the previous study. A 1.9 cm right lower lobe nodule on image 42 with 1.3 cm previously. 1.4 cm nodule in the left lower lobe measured 0.6 cm previously. Numerous other scattered bilateral pulmonary nodules of progressed in the interval. New pulmonary nodules have developed since the prior study (4 mm left upper lobe nodule on image 11, for example).  A linear band of interstitial disease in the medial right lobe has developed in the interval and is compatible with evolving fibrosis from previous radiation treatment.  Bone windows reveal no worrisome lytic or sclerotic osseous lesions.    CT ABDOMEN AND PELVIS FINDINGS  Since the prior study, the patient has developed numerous large hepatic metastases. Index lesion in the dome of the liver measures 3.1 cm in diameter. A second 3.1 cm lesion is identified in the anterior right hepatic lobe. Numerous other hepatic metastases range in size from 5 mm up to 3 cm. The patient now has probably about 20 metastases in the liver.  Calcified granulomata again noted in the spleen. Mild circumferential wall thickening is noted in the distal esophagus, unchanged. Lymphadenopathy adjacent to the distal  esophagus has progressed. The necrotic lymphadenopathy in the gastrohepatic ligament has decreased slightly, measuring 3.7 x 4.9 cm today compared to 3.9 x 5.1 cm previously. Duodenum and pancreas are unremarkable. Gallbladder is normal. The right adrenal lesion has decreased measuring 1.4 x 1.5 cm today compared to 2.5 x 3.6 cm previously.  A new left retroperitoneal lymph node on image 67 measures 1.6 x 2.0 cm. Other retroperitoneal lymphadenopathy is new in the interval.  Small cyst in the upper pole of the right kidney is stable. Left kidney is normal.   Imaging through the pelvis shows no free intraperitoneal fluid. No pelvic sidewall lymphadenopathy. Bladder is unremarkable. Diverticular changes are noted in the left colon without diverticulitis. Terminal ileum is normal. The appendix is not visualized, but there is no edema or inflammation in the region of the cecum.  Bone windows reveal no worrisome lytic or sclerotic osseous lesions.    IMPRESSION: Overall interval progression of disease in the chest and abdomen.  While the subcarinal lymphadenopathy measures slightly smaller today, there has been progression of left hilar and lower mediastinal lymphadenopathy. The patient shows progression of innumerable bilateral pulmonary metastases with new pulmonary metastases identified.  New interval development of numerous hepatic metastases, some of which are 3 cm in diameter.  Slight decrease in gastrohepatic ligament lymphadenopathy and the right adrenal metastatic lesion although the patient has new para-aortic retroperitoneal lymphadenopathy.   Electronically Signed   By: Misty Stanley M.D.   On: 01/01/2014 09:28   ASSESSMENT AND PLAN: This is a very pleasant 53 years old white male with extensive stage small cell lung cancer status post palliative radiotherapy to the mediastinal lymphadenopathy as well as 4 cycles of systemic chemotherapy with carboplatin and etoposide. This was followed by prophylactic cranial irradiation as well as palliative radiotherapy to the enlarging upper abdominal lymphadenopathy as well as the right adrenal metastasis. Unfortunately his recent scan showed evidence for significant disease progression involving the chest and abdomen as well as new liver lesions. I have a lengthy discussion with the patient today about his current disease status and treatment options. I showed them the images of the CT scan. I gave the patient the option of palliative care and hospice referral versus consideration of systemic chemotherapy with second  line treatment in the form of cisplatin and irinotecan or single agent topotecan. I also discussed with the patient consideration of referral to [facility] for evaluation and enrollment in the new therapy clinical trial with Ipilomumab and Nivolumab. The patient is interested in the clinical trial. I will refer him to Dr. Sharlet Salina at [facility] for evaluation and discussion of the clinical trial. I would see him back for follow up visit in 2 weeks for reevaluation and consideration of local treatment with chemotherapy if he is not eligible for the trial at [facility]. The patient agreed to the current plan. He was advised to call immediately if he has any concerning symptoms in the interval. The patient voices understanding of current disease status and treatment options and is in agreement with the current care plan.  All questions were answered. The patient knows to call the clinic with any problems, questions or concerns. We can certainly see the patient much sooner if necessary.  I spent 15 minutes counseling the patient face to face. The total time spent in the appointment was 25 minutes.  Disclaimer: This note was dictated with voice recognition software. Similar sounding words can inadvertently be transcribed and may not  be corrected upon review.

## 2014-01-09 ENCOUNTER — Telehealth: Payer: Self-pay | Admitting: Internal Medicine

## 2014-01-09 NOTE — Telephone Encounter (Signed)
Pt appt. To see Dr. Sharlet Salina @ [facility] is 01/21/14@8 :53. Medical records faxed,slides and scans will be fedex'ed. Left pt's wife a vm to return call.

## 2014-01-16 ENCOUNTER — Encounter: Payer: Self-pay | Admitting: Internal Medicine

## 2014-01-16 ENCOUNTER — Telehealth: Payer: Self-pay | Admitting: *Deleted

## 2014-01-16 ENCOUNTER — Telehealth: Payer: Self-pay | Admitting: Internal Medicine

## 2014-01-16 ENCOUNTER — Ambulatory Visit (HOSPITAL_BASED_OUTPATIENT_CLINIC_OR_DEPARTMENT_OTHER): Payer: BC Managed Care – PPO | Admitting: Internal Medicine

## 2014-01-16 VITALS — BP 136/87 | HR 109 | Temp 97.8°F | Resp 18 | Ht 70.0 in | Wt 175.7 lb

## 2014-01-16 DIAGNOSIS — C3400 Malignant neoplasm of unspecified main bronchus: Secondary | ICD-10-CM

## 2014-01-16 DIAGNOSIS — C779 Secondary and unspecified malignant neoplasm of lymph node, unspecified: Secondary | ICD-10-CM

## 2014-01-16 DIAGNOSIS — R51 Headache: Secondary | ICD-10-CM

## 2014-01-16 DIAGNOSIS — R5383 Other fatigue: Secondary | ICD-10-CM

## 2014-01-16 DIAGNOSIS — C3490 Malignant neoplasm of unspecified part of unspecified bronchus or lung: Secondary | ICD-10-CM

## 2014-01-16 DIAGNOSIS — C7970 Secondary malignant neoplasm of unspecified adrenal gland: Secondary | ICD-10-CM

## 2014-01-16 DIAGNOSIS — C787 Secondary malignant neoplasm of liver and intrahepatic bile duct: Secondary | ICD-10-CM

## 2014-01-16 DIAGNOSIS — R5381 Other malaise: Secondary | ICD-10-CM

## 2014-01-16 DIAGNOSIS — C50919 Malignant neoplasm of unspecified site of unspecified female breast: Secondary | ICD-10-CM

## 2014-01-16 MED ORDER — AZITHROMYCIN 250 MG PO TABS: ORAL_TABLET | ORAL | Status: DC

## 2014-01-16 NOTE — Progress Notes (Signed)
[place] [phone]   Fax:(336) [place], MD [facility] [place] [address]  DIAGNOSIS: Extensive stage small cell lung cancer diagnosed in August of 2014   PRIOR THERAPY:  1) Status post palliative radiotherapy to the mediastinal lymphadenopathy under the care of Dr. Lisbeth Renshaw. 2) Systemic chemotherapy with carboplatin for AUC of 5 on day 1 and etoposide at 120 mg/M2 on days 1, 2 and 3 with Neulasta support on day 4. First cycle given on 08/12/2013. He is status post 4 cycles discontinued today secondary to disease progression.  3) prophylactic cranial irradiation completed 12/04/2013 4) palliative radiotherapy to the enlarging upper abdominal metastatic lymphadenopathy as well as the right adrenal metastasis under the care of Dr. Lisbeth Renshaw completed 12/04/2013  CURRENT THERAPY: None.  CHEMOTHERAPY INTENT: Palliative  CURRENT # OF CHEMOTHERAPY CYCLES: 0 CURRENT ANTIEMETICS: Zofran, dexamethasone and Compazine  CURRENT SMOKING STATUS: Quit smoking  ORAL CHEMOTHERAPY AND CONSENT: None  CURRENT BISPHOSPHONATES USE: None  PAIN MANAGEMENT: 0/10  NARCOTICS INDUCED CONSTIPATION: None  LIVING WILL AND CODE STATUS: Full code   INTERVAL HISTORY: Ray Burns 53 y.o. male returns to the clinic today for followup visit. The patient is doing fine today with no specific complaints except for mild fatigue, aching pain all over his body as well as headache. He is scheduled to see Dr. Sharlet Salina or one of his associates at [place] on Tuesday, 01/25/2014 for second opinion and consideration of enrollment in the immunotherapy clinical trial. He denied having any significant weight loss or night sweats. The patient denied having any chest pain, shortness of breath, cough or hemoptysis. He has no fever or chills, no nausea or vomiting.  MEDICAL HISTORY: Past Medical History    Diagnosis Date  . GERD (gastroesophageal reflux disease)     mild  . Non-small cell lung cancer   . Hx of radiation therapy 08/05/13-08/26/13    chest/lung cancer/ 37.5Gy    ALLERGIES:  has No Known Allergies.  MEDICATIONS:  Current Outpatient Prescriptions  Medication Sig Dispense Refill  . acetaminophen (TYLENOL) 500 MG tablet Take 500 mg by mouth every 6 (six) hours as needed for pain.      Marland Kitchen omeprazole (PRILOSEC OTC) 20 MG tablet Take 20 mg by mouth daily.       No current facility-administered medications for this visit.    SURGICAL HISTORY:  Past Surgical History  Procedure Laterality Date  . Appendectomy      53 y/o  . Video bronchoscopy Bilateral 07/25/2013    Procedure: VIDEO BRONCHOSCOPY WITHOUT FLUORO;  Surgeon: Tanda Rockers, MD;  Location: [facility];  Service: Cardiopulmonary;  Laterality: Bilateral;    REVIEW OF SYSTEMS:  Constitutional: positive for fatigue Eyes: negative Ears, nose, mouth, throat, and face: negative Respiratory: negative Cardiovascular: negative Gastrointestinal: positive for odynophagia Genitourinary:negative Integument/breast: negative Hematologic/lymphatic: negative Musculoskeletal:negative Neurological: positive for headaches Behavioral/Psych: negative Endocrine: negative Allergic/Immunologic: negative   PHYSICAL EXAMINATION: General appearance: alert, cooperative and no distress Head: Normocephalic, without obvious abnormality, atraumatic Neck: no adenopathy, no JVD, supple, symmetrical, trachea midline and thyroid not enlarged, symmetric, no tenderness/mass/nodules Lymph nodes: Cervical, supraclavicular, and axillary nodes normal. Resp: clear to auscultation bilaterally Back: symmetric, no curvature. ROM normal. No CVA tenderness. Cardio: regular rate and rhythm, S1, S2 normal, no murmur, click, rub or gallop GI: soft, non-tender; bowel sounds normal; no masses,  no organomegaly Extremities: extremities normal, atraumatic,  no  cyanosis or edema Neurologic: Alert and oriented X 3, normal strength and tone. Normal symmetric reflexes. Normal coordination and gait  ECOG PERFORMANCE STATUS: 1 - Symptomatic but completely ambulatory  Blood pressure 136/87, pulse 109, temperature 97.8 F (36.6 C), temperature source Oral, resp. rate 18, height 5\' 10"  (1.778 m), weight 175 lb 11.2 oz (79.697 kg).  LABORATORY DATA: Lab Results  Component Value Date   WBC 5.2 01/01/2014   HGB 15.0 01/01/2014   HCT 44.3 01/01/2014   MCV 97.4 01/01/2014   PLT 162 01/01/2014      Chemistry      Component Value Date/Time   NA 140 01/01/2014 0759   NA 136 07/09/2013 1446   K 4.2 01/01/2014 0759   K 4.6 07/09/2013 1446   CL 102 07/09/2013 1446   CO2 24 01/01/2014 0759   CO2 27 07/09/2013 1446   BUN 20.8 01/01/2014 0759   BUN 17 07/09/2013 1446   CREATININE 1.0 01/01/2014 0759   CREATININE 1.0 07/09/2013 1446      Component Value Date/Time   CALCIUM 9.8 01/01/2014 0759   CALCIUM 9.8 07/09/2013 1446   ALKPHOS 69 01/01/2014 0759   ALKPHOS 62 07/09/2013 1446   AST 28 01/01/2014 0759   AST 24 07/09/2013 1446   ALT 28 01/01/2014 0759   ALT 22 07/09/2013 1446   BILITOT 0.34 01/01/2014 0759   BILITOT 0.8 07/09/2013 1446       RADIOGRAPHIC STUDIES: Ct Chest W Contrast  01/01/2014   CLINICAL DATA:  Small cell lung cancer.  EXAM: CT CHEST, ABDOMEN, AND PELVIS WITH CONTRAST  TECHNIQUE: Multidetector CT imaging of the chest, abdomen and pelvis was performed following the standard protocol during bolus administration of intravenous contrast.  CONTRAST:  129mL OMNIPAQUE IOHEXOL 300 MG/ML  SOLN  COMPARISON:  11/01/2013  FINDINGS:   CT CHEST FINDINGS  There is no axillary lymphadenopathy. Partially calcified subcarinal lymphadenopathy is decreased in the interval, measuring 2.9 cm today in the same dimension in which it measured 3.3 cm previously. Despite this decrease in the subcarinal lymph nodes, left hilar lymphadenopathy is progressed with a 1.6 cm lymph  node now identified where lymphoid tissue measured only 0.4 cm previously. The right hilum is stable.  Heart size is normal. No pericardial effusion. No evidence for pleural effusion.  Lung windows show interval progression of pulmonary metastases. 2.3 cm central right upper lobe nodule on image 21 measured 1.0 cm on the previous study. A 1.9 cm right lower lobe nodule on image 42 with 1.3 cm previously. 1.4 cm nodule in the left lower lobe measured 0.6 cm previously. Numerous other scattered bilateral pulmonary nodules of progressed in the interval. New pulmonary nodules have developed since the prior study (4 mm left upper lobe nodule on image 11, for example).  A linear band of interstitial disease in the medial right lobe has developed in the interval and is compatible with evolving fibrosis from previous radiation treatment.  Bone windows reveal no worrisome lytic or sclerotic osseous lesions.    CT ABDOMEN AND PELVIS FINDINGS  Since the prior study, the patient has developed numerous large hepatic metastases. Index lesion in the dome of the liver measures 3.1 cm in diameter. A second 3.1 cm lesion is identified in the anterior right hepatic lobe. Numerous other hepatic metastases range in size from 5 mm up to 3 cm. The patient now has probably about 20 metastases in the liver.  Calcified granulomata again noted in the spleen. Mild circumferential  wall thickening is noted in the distal esophagus, unchanged. Lymphadenopathy adjacent to the distal esophagus has progressed. The necrotic lymphadenopathy in the gastrohepatic ligament has decreased slightly, measuring 3.7 x 4.9 cm today compared to 3.9 x 5.1 cm previously. Duodenum and pancreas are unremarkable. Gallbladder is normal. The right adrenal lesion has decreased measuring 1.4 x 1.5 cm today compared to 2.5 x 3.6 cm previously.  A new left retroperitoneal lymph node on image 67 measures 1.6 x 2.0 cm. Other retroperitoneal lymphadenopathy is new in the  interval.  Small cyst in the upper pole of the right kidney is stable. Left kidney is normal.  Imaging through the pelvis shows no free intraperitoneal fluid. No pelvic sidewall lymphadenopathy. Bladder is unremarkable. Diverticular changes are noted in the left colon without diverticulitis. Terminal ileum is normal. The appendix is not visualized, but there is no edema or inflammation in the region of the cecum.  Bone windows reveal no worrisome lytic or sclerotic osseous lesions.    IMPRESSION: Overall interval progression of disease in the chest and abdomen.  While the subcarinal lymphadenopathy measures slightly smaller today, there has been progression of left hilar and lower mediastinal lymphadenopathy. The patient shows progression of innumerable bilateral pulmonary metastases with new pulmonary metastases identified.  New interval development of numerous hepatic metastases, some of which are 3 cm in diameter.  Slight decrease in gastrohepatic ligament lymphadenopathy and the right adrenal metastatic lesion although the patient has new para-aortic retroperitoneal lymphadenopathy.   Electronically Signed   By: Misty Stanley M.D.   On: 01/01/2014 09:28   ASSESSMENT AND PLAN: This is a very pleasant 53 years old white male with extensive stage small cell lung cancer status post palliative radiotherapy to the mediastinal lymphadenopathy as well as 4 cycles of systemic chemotherapy with carboplatin and etoposide. This was followed by prophylactic cranial irradiation as well as palliative radiotherapy to the enlarging upper abdominal lymphadenopathy as well as the right adrenal metastasis. Unfortunately his recent scan showed evidence for significant disease progression involving the chest and abdomen as well as new liver lesions. He was referred to Dr. Sharlet Salina at [place] and expected to see him next week for a second opinion regarding his progressive small cell lung  cancer. I will order MRI of the brain to rule out brain metastases. I would see him back for follow up visit in 2 weeks for reevaluation and consideration of local treatment with chemotherapy if he is not eligible for the trial at [facility]. The patient agreed to the current plan. He was advised to call immediately if he has any concerning symptoms in the interval. The patient voices understanding of current disease status and treatment options and is in agreement with the current care plan.  All questions were answered. The patient knows to call the clinic with any problems, questions or concerns. We can certainly see the patient much sooner if necessary.  Disclaimer: This note was dictated with voice recognition software. Similar sounding words can inadvertently be transcribed and may not be corrected upon review.

## 2014-01-16 NOTE — Telephone Encounter (Signed)
gv adn printed appt sched anda vs for pt for Feb °

## 2014-01-16 NOTE — Telephone Encounter (Signed)
Pt's wife called very frustrated about pt's status.  She states that he saw Dr Vista Mink this morning and nothing was addressed regarding pt's cough.  He has green sputum as well.  No fever It does not appear that pt had addressed this issue when he came in to see Dr Vista Mink this morning.  Requested that she try to be present for his f/u appts so that information is fully communicated.  Per Dr Vista Mink, okay to give z pak for cough.  SLJ

## 2014-01-16 NOTE — Patient Instructions (Signed)
MRI of the brain to rule out brain metastases.  Followup visit in 2 weeks.

## 2014-01-29 ENCOUNTER — Other Ambulatory Visit: Payer: Self-pay | Admitting: *Deleted

## 2014-01-30 ENCOUNTER — Ambulatory Visit: Payer: BC Managed Care – PPO | Admitting: Internal Medicine

## 2014-01-30 ENCOUNTER — Other Ambulatory Visit: Payer: BC Managed Care – PPO

## 2014-01-30 ENCOUNTER — Telehealth: Payer: Self-pay | Admitting: *Deleted

## 2014-01-30 NOTE — Telephone Encounter (Signed)
Pt spouse called stating that the pt now receives his care from [facility]...td

## 2014-02-04 ENCOUNTER — Other Ambulatory Visit: Payer: Self-pay | Admitting: *Deleted

## 2014-02-04 NOTE — Progress Notes (Signed)
Pt is no longer going to participate in the study at [facility], per Dr Vista Mink, pt needs to be seen next week for a f/u and also have the MRI of the brain done as well.  Onc tx schedule filled out.  SLJ

## 2014-02-06 ENCOUNTER — Telehealth: Payer: Self-pay | Admitting: *Deleted

## 2014-02-06 NOTE — Telephone Encounter (Signed)
Pt called stating that he is not sure he really wants the MRI of the brain.  Per Dr Vista Mink, he recommends that the pt does have it but if the pt does not want it that is his choice.  The MRI is currently scheduled for 3/3, informed pt that he can see Dr Vista Mink on 2/24 and if he decided he is not going to get it we can cancel the MRI appt.  Pt verbalized understanding.  SLJ

## 2014-02-07 ENCOUNTER — Telehealth: Payer: Self-pay | Admitting: Internal Medicine

## 2014-02-07 NOTE — Telephone Encounter (Signed)
lmonvm for pt confirming appts for 2/24 lb/fu and 3/3 mri. due to pt's previous concerns about mri he can fuher discuss mri w/MM at 2/24 visit.

## 2014-02-11 ENCOUNTER — Other Ambulatory Visit: Payer: Self-pay | Admitting: Medical Oncology

## 2014-02-11 ENCOUNTER — Encounter: Payer: Self-pay | Admitting: Internal Medicine

## 2014-02-11 ENCOUNTER — Telehealth: Payer: Self-pay | Admitting: Internal Medicine

## 2014-02-11 ENCOUNTER — Ambulatory Visit (HOSPITAL_BASED_OUTPATIENT_CLINIC_OR_DEPARTMENT_OTHER): Payer: BC Managed Care – PPO | Admitting: Internal Medicine

## 2014-02-11 ENCOUNTER — Telehealth: Payer: Self-pay | Admitting: *Deleted

## 2014-02-11 ENCOUNTER — Other Ambulatory Visit (HOSPITAL_BASED_OUTPATIENT_CLINIC_OR_DEPARTMENT_OTHER): Payer: BC Managed Care – PPO

## 2014-02-11 VITALS — BP 127/82 | HR 111 | Temp 97.8°F | Resp 18 | Ht 70.0 in | Wt 173.7 lb

## 2014-02-11 DIAGNOSIS — C3490 Malignant neoplasm of unspecified part of unspecified bronchus or lung: Secondary | ICD-10-CM

## 2014-02-11 DIAGNOSIS — C3400 Malignant neoplasm of unspecified main bronchus: Secondary | ICD-10-CM

## 2014-02-11 DIAGNOSIS — C50919 Malignant neoplasm of unspecified site of unspecified female breast: Secondary | ICD-10-CM

## 2014-02-11 DIAGNOSIS — C772 Secondary and unspecified malignant neoplasm of intra-abdominal lymph nodes: Secondary | ICD-10-CM

## 2014-02-11 DIAGNOSIS — C779 Secondary and unspecified malignant neoplasm of lymph node, unspecified: Secondary | ICD-10-CM

## 2014-02-11 DIAGNOSIS — C787 Secondary malignant neoplasm of liver and intrahepatic bile duct: Secondary | ICD-10-CM

## 2014-02-11 LAB — COMPREHENSIVE METABOLIC PANEL (CC13)
ALK PHOS: 139 U/L (ref 40–150)
ALT: 61 U/L — ABNORMAL HIGH (ref 0–55)
AST: 92 U/L — ABNORMAL HIGH (ref 5–34)
Albumin: 3.4 g/dL — ABNORMAL LOW (ref 3.5–5.0)
Anion Gap: 12 mEq/L — ABNORMAL HIGH (ref 3–11)
BUN: 18.5 mg/dL (ref 7.0–26.0)
CO2: 25 mEq/L (ref 22–29)
CREATININE: 0.9 mg/dL (ref 0.7–1.3)
Calcium: 9.9 mg/dL (ref 8.4–10.4)
Chloride: 97 mEq/L — ABNORMAL LOW (ref 98–109)
GLUCOSE: 95 mg/dL (ref 70–140)
Potassium: 4.3 mEq/L (ref 3.5–5.1)
SODIUM: 134 meq/L — AB (ref 136–145)
TOTAL PROTEIN: 7.5 g/dL (ref 6.4–8.3)
Total Bilirubin: 1.14 mg/dL (ref 0.20–1.20)

## 2014-02-11 LAB — CBC WITH DIFFERENTIAL/PLATELET
BASO%: 0.7 % (ref 0.0–2.0)
Basophils Absolute: 0.1 10*3/uL (ref 0.0–0.1)
EOS%: 1.1 % (ref 0.0–7.0)
Eosinophils Absolute: 0.1 10*3/uL (ref 0.0–0.5)
HEMATOCRIT: 44.6 % (ref 38.4–49.9)
HGB: 14.8 g/dL (ref 13.0–17.1)
LYMPH%: 6.7 % — ABNORMAL LOW (ref 14.0–49.0)
MCH: 30.9 pg (ref 27.2–33.4)
MCHC: 33.3 g/dL (ref 32.0–36.0)
MCV: 92.7 fL (ref 79.3–98.0)
MONO#: 0.9 10*3/uL (ref 0.1–0.9)
MONO%: 10.3 % (ref 0.0–14.0)
NEUT#: 6.7 10*3/uL — ABNORMAL HIGH (ref 1.5–6.5)
NEUT%: 81.2 % — AB (ref 39.0–75.0)
PLATELETS: 210 10*3/uL (ref 140–400)
RBC: 4.81 10*6/uL (ref 4.20–5.82)
RDW: 13.6 % (ref 11.0–14.6)
WBC: 8.2 10*3/uL (ref 4.0–10.3)
lymph#: 0.5 10*3/uL — ABNORMAL LOW (ref 0.9–3.3)

## 2014-02-11 MED ORDER — METHYLPREDNISOLONE (PAK) 4 MG PO TABS: ORAL_TABLET | ORAL | Status: AC

## 2014-02-11 MED ORDER — OXYCODONE HCL 5 MG PO TABS: 5.0000 mg | ORAL_TABLET | ORAL | Status: AC | PRN

## 2014-02-11 NOTE — Telephone Encounter (Signed)
Gave pt appt for lab,md and chemo for March and April 2015

## 2014-02-11 NOTE — Progress Notes (Signed)
[place] Telephone:([phone]   Fax:(336) [place], MD [facility] [place] [address]  DIAGNOSIS: Extensive stage small cell lung cancer diagnosed in August of 2014   PRIOR THERAPY:  1) Status post palliative radiotherapy to the mediastinal lymphadenopathy under the care of Dr. Lisbeth Renshaw. 2) Systemic chemotherapy with carboplatin for AUC of 5 on day 1 and etoposide at 120 mg/M2 on days 1, 2 and 3 with Neulasta support on day 4. First cycle given on 08/12/2013. He is status post 4 cycles discontinued today secondary to disease progression.  3) prophylactic cranial irradiation completed 12/04/2013 4) palliative radiotherapy to the enlarging upper abdominal metastatic lymphadenopathy as well as the right adrenal metastasis under the care of Dr. Lisbeth Renshaw completed 12/04/2013  CURRENT THERAPY: Systemic chemotherapy with cisplatin 30 mg/M2 and irinotecan 65 mg/M2 on days 1 and 8 every 3 weeks. First dose 02/14/2014.  CHEMOTHERAPY INTENT: Palliative  CURRENT # OF CHEMOTHERAPY CYCLES: 1 CURRENT ANTIEMETICS: Zofran, dexamethasone and Compazine  CURRENT SMOKING STATUS: Quit smoking  ORAL CHEMOTHERAPY AND CONSENT: None  CURRENT BISPHOSPHONATES USE: None  PAIN MANAGEMENT: 0/10  NARCOTICS INDUCED CONSTIPATION: None  LIVING WILL AND CODE STATUS: Full code   INTERVAL HISTORY: Ray Burns 53 y.o. male returns to the clinic today for followup visit accompanied by his wife. The patient is doing fine today with no specific complaints except for mild fatigue, aching pain all over his body as well as headache. He also complains of abdominal pain with nausea and lack of appetite. He was seen by Dr. Sharlet Salina at [place] on Tuesday, 01/25/2014 for second opinion and consideration of enrollment in the immunotherapy clinical trial. He was eligible for the immunotherapy clinical trial with  Ipilumumab and Nivolumab but the patient declined to participate in the trial. He would like to receive chemotherapy locally to avoid any hassles for his wife and family. He is here today for discussion of this option.  He is also refusing MRI of the brain but may consider CT scan of the head. He denied having any night sweats. The patient denied having any chest pain, shortness of breath, cough or hemoptysis. He has no fever or chills, no vomiting.  MEDICAL HISTORY: Past Medical History  Diagnosis Date  . GERD (gastroesophageal reflux disease)     mild  . Non-small cell lung cancer   . Hx of radiation therapy 08/05/13-08/26/13    chest/lung cancer/ 37.5Gy    ALLERGIES:  has No Known Allergies.  MEDICATIONS:  Current Outpatient Prescriptions  Medication Sig Dispense Refill  . ibuprofen (ADVIL,MOTRIN) 200 MG tablet Take 1 tablet by mouth.      Marland Kitchen omeprazole (PRILOSEC OTC) 20 MG tablet Take 20 mg by mouth daily.      . traMADol (ULTRAM) 50 MG tablet Take by mouth every 6 (six) hours as needed. On call doctor prescribed      . acetaminophen (TYLENOL) 500 MG tablet Take 500 mg by mouth every 6 (six) hours as needed for pain.       No current facility-administered medications for this visit.    SURGICAL HISTORY:  Past Surgical History  Procedure Laterality Date  . Appendectomy      52 y/o  . Video bronchoscopy Bilateral 07/25/2013    Procedure: VIDEO BRONCHOSCOPY WITHOUT FLUORO;  Surgeon: Tanda Rockers, MD;  Location: [facility];  Service: Cardiopulmonary;  Laterality: Bilateral;  REVIEW OF SYSTEMS:  Constitutional: positive for fatigue Eyes: negative Ears, nose, mouth, throat, and face: negative Respiratory: negative Cardiovascular: negative Gastrointestinal: positive for odynophagia Genitourinary:negative Integument/breast: negative Hematologic/lymphatic: negative Musculoskeletal:negative Neurological: positive for headaches Behavioral/Psych: negative Endocrine:  negative Allergic/Immunologic: negative   PHYSICAL EXAMINATION: General appearance: alert, cooperative and no distress Head: Normocephalic, without obvious abnormality, atraumatic Neck: no adenopathy, no JVD, supple, symmetrical, trachea midline and thyroid not enlarged, symmetric, no tenderness/mass/nodules Lymph nodes: Cervical, supraclavicular, and axillary nodes normal. Resp: clear to auscultation bilaterally Back: symmetric, no curvature. ROM normal. No CVA tenderness. Cardio: regular rate and rhythm, S1, S2 normal, no murmur, click, rub or gallop GI: soft, non-tender; bowel sounds normal; no masses,  no organomegaly Extremities: extremities normal, atraumatic, no cyanosis or edema Neurologic: Alert and oriented X 3, normal strength and tone. Normal symmetric reflexes. Normal coordination and gait  ECOG PERFORMANCE STATUS: 1 - Symptomatic but completely ambulatory  Blood pressure 127/82, pulse 111, temperature 97.8 F (36.6 C), temperature source Oral, resp. rate 18, height 5\' 10"  (1.778 m), weight 173 lb 11.2 oz (78.79 kg).  LABORATORY DATA: Lab Results  Component Value Date   WBC 8.2 02/11/2014   HGB 14.8 02/11/2014   HCT 44.6 02/11/2014   MCV 92.7 02/11/2014   PLT 210 02/11/2014      Chemistry      Component Value Date/Time   NA 140 01/01/2014 0759   NA 136 07/09/2013 1446   K 4.2 01/01/2014 0759   K 4.6 07/09/2013 1446   CL 102 07/09/2013 1446   CO2 24 01/01/2014 0759   CO2 27 07/09/2013 1446   BUN 20.8 01/01/2014 0759   BUN 17 07/09/2013 1446   CREATININE 1.0 01/01/2014 0759   CREATININE 1.0 07/09/2013 1446      Component Value Date/Time   CALCIUM 9.8 01/01/2014 0759   CALCIUM 9.8 07/09/2013 1446   ALKPHOS 69 01/01/2014 0759   ALKPHOS 62 07/09/2013 1446   AST 28 01/01/2014 0759   AST 24 07/09/2013 1446   ALT 28 01/01/2014 0759   ALT 22 07/09/2013 1446   BILITOT 0.34 01/01/2014 0759   BILITOT 0.8 07/09/2013 1446       RADIOGRAPHIC STUDIES:   ASSESSMENT AND PLAN: This is a  very pleasant 53 years old white male with extensive stage small cell lung cancer status post palliative radiotherapy to the mediastinal lymphadenopathy as well as 4 cycles of systemic chemotherapy with carboplatin and etoposide. This was followed by prophylactic cranial irradiation as well as palliative radiotherapy to the enlarging upper abdominal lymphadenopathy as well as the right adrenal metastasis. Unfortunately his recent scan showed evidence for significant disease progression involving the chest and abdomen as well as new liver lesions. He was referred to Dr. Sharlet Salina at [place] but he declined participation in the immunotherapy clinical trial. I have a lengthy discussion with the patient and his wife today about his treatment options. I recommended for him systemic chemotherapy with cisplatin 30 mg/M2 and irinotecan 65 mg/M2 on days 1 and 8 every 3 weeks. I discussed with the patient adverse effect of this treatment including but not limited to alopecia, myelosuppression, nausea and vomiting, peripheral neuropathy, liver or renal dysfunction. He would like to proceed with treatment as soon as possible. I will order CT scan of the head, chest, abdomen and pelvis for restaging of his disease before starting the first dose of his chemotherapy. The patient would come back for followup visit in 3 weeks for reevaluation and  management any adverse effect of his treatment. For pain management I started the patient on oxycodone 5 mg by mouth every 6 hours as needed for pain. For the lack of appetite and weight loss, I started the patient on Medrol Dosepak.  The patient and his wife agreed to the current plan. He was advised to call immediately if he has any concerning symptoms in the interval. The patient voices understanding of current disease status and treatment options and is in agreement with the current care plan.  All questions were answered. The patient  knows to call the clinic with any problems, questions or concerns. We can certainly see the patient much sooner if necessary. I had a 20 minute to face to face counseling with the patient and his wife out of the total visit time 30 minutes.  Disclaimer: This note was dictated with voice recognition software. Similar sounding words can inadvertently be transcribed and may not be corrected upon review.

## 2014-02-11 NOTE — Telephone Encounter (Signed)
Per staff message and POF I have scheduled appts.  JMW  

## 2014-02-11 NOTE — Telephone Encounter (Signed)
gave pt appt for lab,md and cT emailed michelle regarding chemo

## 2014-02-12 ENCOUNTER — Telehealth: Payer: Self-pay | Admitting: Internal Medicine

## 2014-02-12 ENCOUNTER — Other Ambulatory Visit: Payer: Self-pay | Admitting: Internal Medicine

## 2014-02-12 ENCOUNTER — Ambulatory Visit (HOSPITAL_COMMUNITY)
Admission: RE | Admit: 2014-02-12 | Discharge: 2014-02-12 | Disposition: A | Discharge status: Home / Self Care | Payer: BC Managed Care – PPO | Source: Ambulatory Visit | Attending: Internal Medicine | Admitting: Internal Medicine

## 2014-02-12 DIAGNOSIS — C3490 Malignant neoplasm of unspecified part of unspecified bronchus or lung: Secondary | ICD-10-CM

## 2014-02-12 DIAGNOSIS — C7970 Secondary malignant neoplasm of unspecified adrenal gland: Secondary | ICD-10-CM | POA: Insufficient documentation

## 2014-02-12 DIAGNOSIS — R222 Localized swelling, mass and lump, trunk: Secondary | ICD-10-CM | POA: Insufficient documentation

## 2014-02-12 DIAGNOSIS — C772 Secondary and unspecified malignant neoplasm of intra-abdominal lymph nodes: Secondary | ICD-10-CM | POA: Insufficient documentation

## 2014-02-12 DIAGNOSIS — C7800 Secondary malignant neoplasm of unspecified lung: Secondary | ICD-10-CM | POA: Insufficient documentation

## 2014-02-12 DIAGNOSIS — K5730 Diverticulosis of large intestine without perforation or abscess without bleeding: Secondary | ICD-10-CM | POA: Insufficient documentation

## 2014-02-12 DIAGNOSIS — G319 Degenerative disease of nervous system, unspecified: Secondary | ICD-10-CM | POA: Insufficient documentation

## 2014-02-12 DIAGNOSIS — R599 Enlarged lymph nodes, unspecified: Secondary | ICD-10-CM | POA: Insufficient documentation

## 2014-02-12 DIAGNOSIS — Z9221 Personal history of antineoplastic chemotherapy: Secondary | ICD-10-CM | POA: Insufficient documentation

## 2014-02-12 DIAGNOSIS — Z923 Personal history of irradiation: Secondary | ICD-10-CM | POA: Insufficient documentation

## 2014-02-12 DIAGNOSIS — C787 Secondary malignant neoplasm of liver and intrahepatic bile duct: Secondary | ICD-10-CM | POA: Insufficient documentation

## 2014-02-12 DIAGNOSIS — E279 Disorder of adrenal gland, unspecified: Secondary | ICD-10-CM | POA: Insufficient documentation

## 2014-02-12 DIAGNOSIS — R918 Other nonspecific abnormal finding of lung field: Secondary | ICD-10-CM | POA: Insufficient documentation

## 2014-02-12 MED ORDER — IOHEXOL 300 MG/ML  SOLN
100.0000 mL | Freq: Once | INTRAMUSCULAR | Status: AC | PRN
  Administered 2014-02-12: 100 mL via INTRAVENOUS

## 2014-02-12 NOTE — Telephone Encounter (Signed)
Gave pt appt for lab,md and chemo for  february and MArch 2015

## 2014-02-14 ENCOUNTER — Ambulatory Visit (HOSPITAL_BASED_OUTPATIENT_CLINIC_OR_DEPARTMENT_OTHER): Payer: BC Managed Care – PPO

## 2014-02-14 VITALS — BP 128/85 | HR 88 | Temp 97.6°F | Resp 20

## 2014-02-14 DIAGNOSIS — C7970 Secondary malignant neoplasm of unspecified adrenal gland: Secondary | ICD-10-CM

## 2014-02-14 DIAGNOSIS — C787 Secondary malignant neoplasm of liver and intrahepatic bile duct: Secondary | ICD-10-CM

## 2014-02-14 DIAGNOSIS — C3490 Malignant neoplasm of unspecified part of unspecified bronchus or lung: Secondary | ICD-10-CM

## 2014-02-14 DIAGNOSIS — Z5111 Encounter for antineoplastic chemotherapy: Secondary | ICD-10-CM

## 2014-02-14 DIAGNOSIS — C3400 Malignant neoplasm of unspecified main bronchus: Secondary | ICD-10-CM

## 2014-02-14 MED ORDER — PALONOSETRON HCL INJECTION 0.25 MG/5ML
0.2500 mg | Freq: Once | INTRAVENOUS | Status: AC
  Administered 2014-02-14: 0.25 mg via INTRAVENOUS

## 2014-02-14 MED ORDER — DEXTROSE-NACL 5-0.45 % IV SOLN
Freq: Once | INTRAVENOUS | Status: AC
  Administered 2014-02-14: 11:00:00 via INTRAVENOUS
  Filled 2014-02-14: qty 10

## 2014-02-14 MED ORDER — IRINOTECAN HCL CHEMO INJECTION 100 MG/5ML
65.0000 mg/m2 | Freq: Once | INTRAVENOUS | Status: AC
  Administered 2014-02-14: 130 mg via INTRAVENOUS
  Filled 2014-02-14: qty 6.5

## 2014-02-14 MED ORDER — SODIUM CHLORIDE 0.9 % IV SOLN
30.0000 mg/m2 | Freq: Once | INTRAVENOUS | Status: AC
  Administered 2014-02-14: 60 mg via INTRAVENOUS
  Filled 2014-02-14: qty 60

## 2014-02-14 MED ORDER — HEPARIN SOD (PORK) LOCK FLUSH 100 UNIT/ML IV SOLN
500.0000 [IU] | Freq: Once | INTRAVENOUS | Status: DC | PRN
  Filled 2014-02-14: qty 5

## 2014-02-14 MED ORDER — PALONOSETRON HCL INJECTION 0.25 MG/5ML
INTRAVENOUS | Status: AC
  Filled 2014-02-14: qty 5

## 2014-02-14 MED ORDER — DEXAMETHASONE SODIUM PHOSPHATE 20 MG/5ML IJ SOLN
12.0000 mg | Freq: Once | INTRAMUSCULAR | Status: AC
  Administered 2014-02-14: 12 mg via INTRAVENOUS

## 2014-02-14 MED ORDER — FOSAPREPITANT DIMEGLUMINE INJECTION 150 MG
150.0000 mg | Freq: Once | INTRAVENOUS | Status: AC
  Administered 2014-02-14: 150 mg via INTRAVENOUS
  Filled 2014-02-14: qty 5

## 2014-02-14 MED ORDER — ATROPINE SULFATE 1 MG/ML IJ SOLN: 0.5000 mg | Freq: Once | INTRAMUSCULAR | Status: DC | PRN

## 2014-02-14 MED ORDER — SODIUM CHLORIDE 0.9 % IV SOLN
Freq: Once | INTRAVENOUS | Status: AC
  Administered 2014-02-14: 11:00:00 via INTRAVENOUS

## 2014-02-14 MED ORDER — DEXAMETHASONE SODIUM PHOSPHATE 20 MG/5ML IJ SOLN
INTRAMUSCULAR | Status: AC
  Filled 2014-02-14: qty 5

## 2014-02-14 MED ORDER — SODIUM CHLORIDE 0.9 % IJ SOLN
10.0000 mL | INTRAMUSCULAR | Status: DC | PRN
  Filled 2014-02-14: qty 10

## 2014-02-14 NOTE — Patient Instructions (Signed)
[facility] Discharge Instructions for Patients Receiving Chemotherapy  Today you received the following chemotherapy agents Irinotecan, Cisplatin.   To help prevent nausea and vomiting after your treatment, we encourage you to take your nausea medication as prescribed.    If you develop nausea and vomiting that is not controlled by your nausea medication, call the clinic.   BELOW ARE SYMPTOMS THAT SHOULD BE REPORTED IMMEDIATELY:  *FEVER GREATER THAN 100.5 F  *CHILLS WITH OR WITHOUT FEVER  NAUSEA AND VOMITING THAT IS NOT CONTROLLED WITH YOUR NAUSEA MEDICATION  *UNUSUAL SHORTNESS OF BREATH  *UNUSUAL BRUISING OR BLEEDING  TENDERNESS IN MOUTH AND THROAT WITH OR WITHOUT PRESENCE OF ULCERS  *URINARY PROBLEMS  *BOWEL PROBLEMS  UNUSUAL RASH Items with * indicate a potential emergency and should be followed up as soon as possible.  Feel free to call the clinic should you have any questions or concerns. The clinic phone number is [phone].  It was my pleasure to take care of you today!  Leeanne Rio, RN  Irinotecan injection What is this medicine? IRINOTECAN (ir in oh TEE kan ) is a chemotherapy drug. It is used to treat colon and rectal cancer. This medicine may be used for other purposes; ask your health care provider or pharmacist if you have questions. COMMON BRAND NAME(S): Camptosar What should I tell my health care provider before I take this medicine? They need to know if you have any of these conditions: -blood disorders -dehydration -diarrhea -infection (especially a virus infection such as chickenpox, cold sores, or herpes) -liver disease -low blood counts, like low white cell, platelet, or red cell counts -recent or ongoing radiation therapy -an unusual or allergic reaction to irinotecan, sorbitol, other chemotherapy, other medicines, foods, dyes, or preservatives -pregnant or trying to get pregnant -breast-feeding How should I use this  medicine? This drug is given as an infusion into a vein. It is administered in a hospital or clinic by a specially trained health care professional. Talk to your pediatrician regarding the use of this medicine in children. Special care may be needed. Overdosage: If you think you have taken too much of this medicine contact a poison control center or emergency room at once. NOTE: This medicine is only for you. Do not share this medicine with others. What if I miss a dose? It is important not to miss your dose. Call your doctor or health care professional if you are unable to keep an appointment. What may interact with this medicine? Do not take this medicine with any of the following medications: -atazanavir -ketoconazole -St. John's Wort This medicine may also interact with the following medications: -dexamethasone -diuretics -laxatives -medicines for seizures like carbamazepine, mephobarbital, phenobarbital, phenytoin, primidone -medicines to increase blood counts like filgrastim, pegfilgrastim, sargramostim -prochlorperazine -vaccines This list may not describe all possible interactions. Give your health care provider a list of all the medicines, herbs, non-prescription drugs, or dietary supplements you use. Also tell them if you smoke, drink alcohol, or use illegal drugs. Some items may interact with your medicine. What should I watch for while using this medicine? Your condition will be monitored carefully while you are receiving this medicine. You will need important blood work done while you are taking this medicine. This drug may make you feel generally unwell. This is not uncommon, as chemotherapy can affect healthy cells as well as cancer cells. Report any side effects. Continue your course of treatment even though you feel ill unless your doctor tells you  to stop. In some cases, you may be given additional medicines to help with side effects. Follow all directions for their  use. You may get drowsy or dizzy. Do not drive, use machinery, or do anything that needs mental alertness until you know how this medicine affects you. Do not stand or sit up quickly, especially if you are an older patient. This reduces the risk of dizzy or fainting spells. Call your doctor or health care professional for advice if you get a fever, chills or sore throat, or other symptoms of a cold or flu. Do not treat yourself. This drug decreases your body's ability to fight infections. Try to avoid being around people who are sick. This medicine may increase your risk to bruise or bleed. Call your doctor or health care professional if you notice any unusual bleeding. Be careful brushing and flossing your teeth or using a toothpick because you may get an infection or bleed more easily. If you have any dental work done, tell your dentist you are receiving this medicine. Avoid taking products that contain aspirin, acetaminophen, ibuprofen, naproxen, or ketoprofen unless instructed by your doctor. These medicines may hide a fever. Do not become pregnant while taking this medicine. Women should inform their doctor if they wish to become pregnant or think they might be pregnant. There is a potential for serious side effects to an unborn child. Talk to your health care professional or pharmacist for more information. Do not breast-feed an infant while taking this medicine. What side effects may I notice from receiving this medicine? Side effects that you should report to your doctor or health care professional as soon as possible: -allergic reactions like skin rash, itching or hives, swelling of the face, lips, or tongue -low blood counts - this medicine may decrease the number of white blood cells, red blood cells and platelets. You may be at increased risk for infections and bleeding. -signs of infection - fever or chills, cough, sore throat, pain or difficulty passing urine -signs of decreased platelets  or bleeding - bruising, pinpoint red spots on the skin, black, tarry stools, blood in the urine -signs of decreased red blood cells - unusually weak or tired, fainting spells, lightheadedness -breathing problems -chest pain -diarrhea -feeling faint or lightheaded, falls -flushing, runny nose, sweating during infusion -mouth sores or pain -pain, swelling, redness or irritation where injected -pain, swelling, warmth in the leg -pain, tingling, numbness in the hands or feet -problems with balance, talking, walking -stomach cramps, pain -trouble passing urine or change in the amount of urine -vomiting as to be unable to hold down drinks or food -yellowing of the eyes or skin Side effects that usually do not require medical attention (report to your doctor or health care professional if they continue or are bothersome): -constipation -hair loss -headache -loss of appetite -nausea, vomiting -stomach upset This list may not describe all possible side effects. Call your doctor for medical advice about side effects. You may report side effects to FDA at 1-800-FDA-1088. Where should I keep my medicine? This drug is given in a hospital or clinic and will not be stored at home. NOTE: This sheet is a summary. It may not cover all possible information. If you have questions about this medicine, talk to your doctor, pharmacist, or health care provider.  2014, Elsevier/Gold Standard. (2008-04-22 16:29:12)  Cisplatin injection What is this medicine? CISPLATIN (SIS pla tin) is a chemotherapy drug. It targets fast dividing cells, like cancer cells, and causes these  cells to die. This medicine is used to treat many types of cancer like bladder, ovarian, and testicular cancers. This medicine may be used for other purposes; ask your health care provider or pharmacist if you have questions. COMMON BRAND NAME(S): Platinol -AQ, Platinol What should I tell my health care provider before I take this  medicine? They need to know if you have any of these conditions: -blood disorders -hearing problems -kidney disease -recent or ongoing radiation therapy -an unusual or allergic reaction to cisplatin, carboplatin, other chemotherapy, other medicines, foods, dyes, or preservatives -pregnant or trying to get pregnant -breast-feeding How should I use this medicine? This drug is given as an infusion into a vein. It is administered in a hospital or clinic by a specially trained health care professional. Talk to your pediatrician regarding the use of this medicine in children. Special care may be needed. Overdosage: If you think you have taken too much of this medicine contact a poison control center or emergency room at once. NOTE: This medicine is only for you. Do not share this medicine with others. What if I miss a dose? It is important not to miss a dose. Call your doctor or health care professional if you are unable to keep an appointment. What may interact with this medicine? -dofetilide -foscarnet -medicines for seizures -medicines to increase blood counts like filgrastim, pegfilgrastim, sargramostim -probenecid -pyridoxine used with altretamine -rituximab -some antibiotics like amikacin, gentamicin, neomycin, polymyxin B, streptomycin, tobramycin -sulfinpyrazone -vaccines -zalcitabine Talk to your doctor or health care professional before taking any of these medicines: -acetaminophen -aspirin -ibuprofen -ketoprofen -naproxen This list may not describe all possible interactions. Give your health care provider a list of all the medicines, herbs, non-prescription drugs, or dietary supplements you use. Also tell them if you smoke, drink alcohol, or use illegal drugs. Some items may interact with your medicine. What should I watch for while using this medicine? Your condition will be monitored carefully while you are receiving this medicine. You will need important blood work done  while you are taking this medicine. This drug may make you feel generally unwell. This is not uncommon, as chemotherapy can affect healthy cells as well as cancer cells. Report any side effects. Continue your course of treatment even though you feel ill unless your doctor tells you to stop. In some cases, you may be given additional medicines to help with side effects. Follow all directions for their use. Call your doctor or health care professional for advice if you get a fever, chills or sore throat, or other symptoms of a cold or flu. Do not treat yourself. This drug decreases your body's ability to fight infections. Try to avoid being around people who are sick. This medicine may increase your risk to bruise or bleed. Call your doctor or health care professional if you notice any unusual bleeding. Be careful brushing and flossing your teeth or using a toothpick because you may get an infection or bleed more easily. If you have any dental work done, tell your dentist you are receiving this medicine. Avoid taking products that contain aspirin, acetaminophen, ibuprofen, naproxen, or ketoprofen unless instructed by your doctor. These medicines may hide a fever. Do not become pregnant while taking this medicine. Women should inform their doctor if they wish to become pregnant or think they might be pregnant. There is a potential for serious side effects to an unborn child. Talk to your health care professional or pharmacist for more information. Do not breast-feed an  infant while taking this medicine. Drink fluids as directed while you are taking this medicine. This will help protect your kidneys. Call your doctor or health care professional if you get diarrhea. Do not treat yourself. What side effects may I notice from receiving this medicine? Side effects that you should report to your doctor or health care professional as soon as possible: -allergic reactions like skin rash, itching or hives, swelling  of the face, lips, or tongue -signs of infection - fever or chills, cough, sore throat, pain or difficulty passing urine -signs of decreased platelets or bleeding - bruising, pinpoint red spots on the skin, black, tarry stools, nosebleeds -signs of decreased red blood cells - unusually weak or tired, fainting spells, lightheadedness -breathing problems -changes in hearing -gout pain -low blood counts - This drug may decrease the number of white blood cells, red blood cells and platelets. You may be at increased risk for infections and bleeding. -nausea and vomiting -pain, swelling, redness or irritation at the injection site -pain, tingling, numbness in the hands or feet -problems with balance, movement -trouble passing urine or change in the amount of urine Side effects that usually do not require medical attention (report to your doctor or health care professional if they continue or are bothersome): -changes in vision -loss of appetite -metallic taste in the mouth or changes in taste This list may not describe all possible side effects. Call your doctor for medical advice about side effects. You may report side effects to FDA at 1-800-FDA-1088. Where should I keep my medicine? This drug is given in a hospital or clinic and will not be stored at home. NOTE: This sheet is a summary. It may not cover all possible information. If you have questions about this medicine, talk to your doctor, pharmacist, or health care provider.  2014, Elsevier/Gold Standard. (2008-03-11 14:40:54)

## 2014-02-17 ENCOUNTER — Telehealth: Payer: Self-pay | Admitting: *Deleted

## 2014-02-17 ENCOUNTER — Other Ambulatory Visit: Payer: Self-pay | Admitting: Medical Oncology

## 2014-02-17 DIAGNOSIS — R112 Nausea with vomiting, unspecified: Secondary | ICD-10-CM

## 2014-02-17 DIAGNOSIS — T451X5A Adverse effect of antineoplastic and immunosuppressive drugs, initial encounter: Principal | ICD-10-CM

## 2014-02-17 MED ORDER — PROCHLORPERAZINE MALEATE 10 MG PO TABS: 10.0000 mg | ORAL_TABLET | Freq: Four times a day (QID) | ORAL | Status: AC | PRN

## 2014-02-17 NOTE — Telephone Encounter (Signed)
Called pt at home for chemo follow up call.  Spoke with wife [name].  Per [employer], pt was doing fine after chemo on Fri.  Pt has been taking Compazine 10 mg every 6 hours for nausea - even though pt has not been feeling nauseated.  [name] stated appetite is fair, drinking lots of fluids as tolerated.  Bowel and bladder function fine. Went over dietary choices for fluid intake.  Pt was instructed to complete Medrol Dose Pak given by Dr. Julien Nordmann for appetite. Went over instructions on how to take Compazine as needed to prevent nausea/vomiting.  [name] verbalized understanding. Instructed [name] to call pt's pharmacy for refill of Compazine.

## 2014-02-17 NOTE — Telephone Encounter (Signed)
compazine refill called in .

## 2014-02-18 ENCOUNTER — Ambulatory Visit (HOSPITAL_COMMUNITY): Payer: BC Managed Care – PPO

## 2014-02-19 ENCOUNTER — Telehealth: Payer: Self-pay | Admitting: Dietician

## 2014-02-19 NOTE — Telephone Encounter (Signed)
Brief Outpatient Oncology Nutrition Note  Patient has been identified to be at risk on malnutrition screen.  Wt Readings from Last 10 Encounters:  02/11/14 173 lb 11.2 oz (78.79 kg)  01/16/14 175 lb 11.2 oz (79.697 kg)  01/02/14 181 lb 4.8 oz (82.237 kg)  11/29/13 184 lb 11.2 oz (83.779 kg)  11/21/13 184 lb 14.4 oz (83.87 kg)  11/06/13 183 lb (83.008 kg)  11/04/13 187 lb (84.823 kg)  10/14/13 183 lb 9.6 oz (83.28 kg)  10/03/13 179 lb 12.8 oz (81.557 kg)  09/23/13 178 lb 12.8 oz (81.103 kg)    Dx:  Extensive stage small cell lung cancer with mets diagnosed in August of 2014.  S/p palliative radiotherapy.  Undergoing palliative chemo.  Called patient due to weight loss.  Spoke with wife who stated that patient has not been eating for the past couple of days since medrol pack ended.  Patient with poor appetite and complaints of constipation.  Reports that he is drinking fluids well.  Does not like supplements and has many food aversions currently.  Discussed tips to increase calories and protein as well as tips to help with constipation.  Will mail fact sheets on constipation and tips to improve calories and protein and contact information for the [facility].  Antonieta Iba, RD, LDN

## 2014-02-21 ENCOUNTER — Other Ambulatory Visit: Payer: Self-pay | Admitting: Hematology and Oncology

## 2014-02-21 ENCOUNTER — Ambulatory Visit (HOSPITAL_BASED_OUTPATIENT_CLINIC_OR_DEPARTMENT_OTHER): Payer: BC Managed Care – PPO

## 2014-02-21 ENCOUNTER — Other Ambulatory Visit (HOSPITAL_BASED_OUTPATIENT_CLINIC_OR_DEPARTMENT_OTHER): Payer: BC Managed Care – PPO

## 2014-02-21 VITALS — BP 118/77 | HR 90 | Temp 97.5°F | Resp 16

## 2014-02-21 DIAGNOSIS — C7970 Secondary malignant neoplasm of unspecified adrenal gland: Secondary | ICD-10-CM

## 2014-02-21 DIAGNOSIS — C3400 Malignant neoplasm of unspecified main bronchus: Secondary | ICD-10-CM

## 2014-02-21 DIAGNOSIS — C3490 Malignant neoplasm of unspecified part of unspecified bronchus or lung: Secondary | ICD-10-CM

## 2014-02-21 DIAGNOSIS — C50919 Malignant neoplasm of unspecified site of unspecified female breast: Secondary | ICD-10-CM

## 2014-02-21 DIAGNOSIS — C787 Secondary malignant neoplasm of liver and intrahepatic bile duct: Secondary | ICD-10-CM

## 2014-02-21 DIAGNOSIS — C779 Secondary and unspecified malignant neoplasm of lymph node, unspecified: Secondary | ICD-10-CM

## 2014-02-21 DIAGNOSIS — Z5111 Encounter for antineoplastic chemotherapy: Secondary | ICD-10-CM

## 2014-02-21 LAB — COMPREHENSIVE METABOLIC PANEL (CC13)
ALBUMIN: 2.8 g/dL — AB (ref 3.5–5.0)
ALT: 63 U/L — AB (ref 0–55)
AST: 66 U/L — AB (ref 5–34)
Alkaline Phosphatase: 148 U/L (ref 40–150)
Anion Gap: 8 mEq/L (ref 3–11)
BUN: 23.3 mg/dL (ref 7.0–26.0)
CALCIUM: 8.6 mg/dL (ref 8.4–10.4)
CHLORIDE: 102 meq/L (ref 98–109)
CO2: 24 mEq/L (ref 22–29)
Creatinine: 0.8 mg/dL (ref 0.7–1.3)
Glucose: 158 mg/dl — ABNORMAL HIGH (ref 70–140)
POTASSIUM: 3.6 meq/L (ref 3.5–5.1)
SODIUM: 134 meq/L — AB (ref 136–145)
Total Bilirubin: 0.71 mg/dL (ref 0.20–1.20)
Total Protein: 6 g/dL — ABNORMAL LOW (ref 6.4–8.3)

## 2014-02-21 LAB — CBC WITH DIFFERENTIAL/PLATELET
BASO%: 0.2 % (ref 0.0–2.0)
Basophils Absolute: 0 10*3/uL (ref 0.0–0.1)
EOS%: 1.2 % (ref 0.0–7.0)
Eosinophils Absolute: 0.1 10*3/uL (ref 0.0–0.5)
HCT: 40.9 % (ref 38.4–49.9)
HEMOGLOBIN: 14 g/dL (ref 13.0–17.1)
LYMPH#: 0.6 10*3/uL — AB (ref 0.9–3.3)
LYMPH%: 11.6 % — ABNORMAL LOW (ref 14.0–49.0)
MCH: 31 pg (ref 27.2–33.4)
MCHC: 34.2 g/dL (ref 32.0–36.0)
MCV: 90.5 fL (ref 79.3–98.0)
MONO#: 0.5 10*3/uL (ref 0.1–0.9)
MONO%: 9.4 % (ref 0.0–14.0)
NEUT#: 3.9 10*3/uL (ref 1.5–6.5)
NEUT%: 77.6 % — ABNORMAL HIGH (ref 39.0–75.0)
Platelets: 182 10*3/uL (ref 140–400)
RBC: 4.52 10*6/uL (ref 4.20–5.82)
RDW: 13.5 % (ref 11.0–14.6)
WBC: 5 10*3/uL (ref 4.0–10.3)

## 2014-02-21 LAB — MAGNESIUM (CC13): Magnesium: 1.9 mg/dl (ref 1.5–2.5)

## 2014-02-21 MED ORDER — PALONOSETRON HCL INJECTION 0.25 MG/5ML
0.2500 mg | Freq: Once | INTRAVENOUS | Status: AC
  Administered 2014-02-21: 0.25 mg via INTRAVENOUS

## 2014-02-21 MED ORDER — SODIUM CHLORIDE 0.9 % IV SOLN
150.0000 mg | Freq: Once | INTRAVENOUS | Status: AC
  Administered 2014-02-21: 150 mg via INTRAVENOUS
  Filled 2014-02-21: qty 5

## 2014-02-21 MED ORDER — PALONOSETRON HCL INJECTION 0.25 MG/5ML
INTRAVENOUS | Status: AC
  Filled 2014-02-21: qty 5

## 2014-02-21 MED ORDER — IRINOTECAN HCL CHEMO INJECTION 100 MG/5ML
65.0000 mg/m2 | Freq: Once | INTRAVENOUS | Status: AC
  Administered 2014-02-21: 130 mg via INTRAVENOUS
  Filled 2014-02-21: qty 6.5

## 2014-02-21 MED ORDER — DEXTROSE-NACL 5-0.45 % IV SOLN
Freq: Once | INTRAVENOUS | Status: AC
  Administered 2014-02-21: 11:00:00 via INTRAVENOUS
  Filled 2014-02-21: qty 10

## 2014-02-21 MED ORDER — SODIUM CHLORIDE 0.9 % IV SOLN
Freq: Once | INTRAVENOUS | Status: AC
  Administered 2014-02-21: 11:00:00 via INTRAVENOUS

## 2014-02-21 MED ORDER — DEXAMETHASONE SODIUM PHOSPHATE 20 MG/5ML IJ SOLN
INTRAMUSCULAR | Status: AC
  Filled 2014-02-21: qty 5

## 2014-02-21 MED ORDER — DEXAMETHASONE SODIUM PHOSPHATE 20 MG/5ML IJ SOLN
12.0000 mg | Freq: Once | INTRAMUSCULAR | Status: AC
  Administered 2014-02-21: 12 mg via INTRAVENOUS

## 2014-02-21 MED ORDER — SODIUM CHLORIDE 0.9 % IV SOLN
30.0000 mg/m2 | Freq: Once | INTRAVENOUS | Status: AC
  Administered 2014-02-21: 60 mg via INTRAVENOUS
  Filled 2014-02-21: qty 60

## 2014-02-21 NOTE — Patient Instructions (Signed)
[facility] Discharge Instructions for Patients Receiving Chemotherapy  Today you received the following chemotherapy agents cisplatin and camptosar.  To help prevent nausea and vomiting after your treatment, we encourage you to take your nausea medication compazine.   If you develop nausea and vomiting that is not controlled by your nausea medication, call the clinic.   BELOW ARE SYMPTOMS THAT SHOULD BE REPORTED IMMEDIATELY:  *FEVER GREATER THAN 100.5 F  *CHILLS WITH OR WITHOUT FEVER  NAUSEA AND VOMITING THAT IS NOT CONTROLLED WITH YOUR NAUSEA MEDICATION  *UNUSUAL SHORTNESS OF BREATH  *UNUSUAL BRUISING OR BLEEDING  TENDERNESS IN MOUTH AND THROAT WITH OR WITHOUT PRESENCE OF ULCERS  *URINARY PROBLEMS  *BOWEL PROBLEMS  UNUSUAL RASH Items with * indicate a potential emergency and should be followed up as soon as possible.  Feel free to call the clinic you have any questions or concerns. The clinic phone number is [phone].

## 2014-02-27 ENCOUNTER — Other Ambulatory Visit (HOSPITAL_BASED_OUTPATIENT_CLINIC_OR_DEPARTMENT_OTHER): Payer: BC Managed Care – PPO

## 2014-02-27 ENCOUNTER — Telehealth: Payer: Self-pay | Admitting: *Deleted

## 2014-02-27 ENCOUNTER — Other Ambulatory Visit: Payer: BC Managed Care – PPO

## 2014-02-27 DIAGNOSIS — C787 Secondary malignant neoplasm of liver and intrahepatic bile duct: Secondary | ICD-10-CM

## 2014-02-27 DIAGNOSIS — C50919 Malignant neoplasm of unspecified site of unspecified female breast: Secondary | ICD-10-CM

## 2014-02-27 DIAGNOSIS — C7970 Secondary malignant neoplasm of unspecified adrenal gland: Secondary | ICD-10-CM

## 2014-02-27 DIAGNOSIS — C3400 Malignant neoplasm of unspecified main bronchus: Secondary | ICD-10-CM

## 2014-02-27 DIAGNOSIS — C779 Secondary and unspecified malignant neoplasm of lymph node, unspecified: Secondary | ICD-10-CM

## 2014-02-27 DIAGNOSIS — C3490 Malignant neoplasm of unspecified part of unspecified bronchus or lung: Secondary | ICD-10-CM

## 2014-02-27 LAB — CBC WITH DIFFERENTIAL/PLATELET
BASO%: 0.3 % (ref 0.0–2.0)
Basophils Absolute: 0 10*3/uL (ref 0.0–0.1)
EOS ABS: 0.1 10*3/uL (ref 0.0–0.5)
EOS%: 4.3 % (ref 0.0–7.0)
HEMATOCRIT: 37 % — AB (ref 38.4–49.9)
HGB: 12.3 g/dL — ABNORMAL LOW (ref 13.0–17.1)
LYMPH%: 19.5 % (ref 14.0–49.0)
MCH: 30.7 pg (ref 27.2–33.4)
MCHC: 33.2 g/dL (ref 32.0–36.0)
MCV: 92.6 fL (ref 79.3–98.0)
MONO#: 0.1 10*3/uL (ref 0.1–0.9)
MONO%: 3.2 % (ref 0.0–14.0)
NEUT%: 72.7 % (ref 39.0–75.0)
NEUTROS ABS: 1.3 10*3/uL — AB (ref 1.5–6.5)
PLATELETS: 106 10*3/uL — AB (ref 140–400)
RBC: 3.99 10*6/uL — ABNORMAL LOW (ref 4.20–5.82)
RDW: 14 % (ref 11.0–14.6)
WBC: 1.8 10*3/uL — ABNORMAL LOW (ref 4.0–10.3)
lymph#: 0.4 10*3/uL — ABNORMAL LOW (ref 0.9–3.3)

## 2014-02-27 LAB — COMPREHENSIVE METABOLIC PANEL (CC13)
ALT: 50 U/L (ref 0–55)
ANION GAP: 11 meq/L (ref 3–11)
AST: 58 U/L — ABNORMAL HIGH (ref 5–34)
Albumin: 2.6 g/dL — ABNORMAL LOW (ref 3.5–5.0)
Alkaline Phosphatase: 170 U/L — ABNORMAL HIGH (ref 40–150)
BILIRUBIN TOTAL: 0.73 mg/dL (ref 0.20–1.20)
BUN: 18.3 mg/dL (ref 7.0–26.0)
CO2: 25 meq/L (ref 22–29)
CREATININE: 0.8 mg/dL (ref 0.7–1.3)
Calcium: 8.8 mg/dL (ref 8.4–10.4)
Chloride: 101 mEq/L (ref 98–109)
GLUCOSE: 158 mg/dL — AB (ref 70–140)
Potassium: 4.4 mEq/L (ref 3.5–5.1)
Sodium: 137 mEq/L (ref 136–145)
Total Protein: 5.8 g/dL — ABNORMAL LOW (ref 6.4–8.3)

## 2014-02-27 LAB — MAGNESIUM (CC13): MAGNESIUM: 1.6 mg/dL (ref 1.5–2.5)

## 2014-02-27 NOTE — Telephone Encounter (Signed)
On 02-27-13 check with Dr. Sondra Come ok to give to the pt consult note, end of tx , follow up note, there is a release in the system that [facility] use call  [facility].

## 2014-02-28 ENCOUNTER — Other Ambulatory Visit: Payer: BC Managed Care – PPO

## 2014-03-05 ENCOUNTER — Ambulatory Visit (HOSPITAL_COMMUNITY)
Admission: RE | Admit: 2014-03-05 | Discharge: 2014-03-05 | Disposition: A | Discharge status: Home / Self Care | Payer: BC Managed Care – PPO | Source: Ambulatory Visit | Attending: Physician Assistant | Admitting: Physician Assistant

## 2014-03-05 ENCOUNTER — Ambulatory Visit: Payer: BC Managed Care – PPO

## 2014-03-05 ENCOUNTER — Ambulatory Visit (HOSPITAL_BASED_OUTPATIENT_CLINIC_OR_DEPARTMENT_OTHER): Payer: BC Managed Care – PPO

## 2014-03-05 ENCOUNTER — Telehealth: Payer: Self-pay | Admitting: Internal Medicine

## 2014-03-05 ENCOUNTER — Encounter: Payer: Self-pay | Admitting: Physician Assistant

## 2014-03-05 ENCOUNTER — Ambulatory Visit (HOSPITAL_BASED_OUTPATIENT_CLINIC_OR_DEPARTMENT_OTHER): Payer: BC Managed Care – PPO | Admitting: Physician Assistant

## 2014-03-05 ENCOUNTER — Other Ambulatory Visit (HOSPITAL_BASED_OUTPATIENT_CLINIC_OR_DEPARTMENT_OTHER): Payer: BC Managed Care – PPO

## 2014-03-05 VITALS — BP 112/74 | HR 120 | Temp 99.8°F | Resp 18 | Ht 70.0 in | Wt 167.0 lb

## 2014-03-05 DIAGNOSIS — R109 Unspecified abdominal pain: Secondary | ICD-10-CM

## 2014-03-05 DIAGNOSIS — D701 Agranulocytosis secondary to cancer chemotherapy: Secondary | ICD-10-CM

## 2014-03-05 DIAGNOSIS — R0602 Shortness of breath: Secondary | ICD-10-CM

## 2014-03-05 DIAGNOSIS — C779 Secondary and unspecified malignant neoplasm of lymph node, unspecified: Secondary | ICD-10-CM

## 2014-03-05 DIAGNOSIS — C787 Secondary malignant neoplasm of liver and intrahepatic bile duct: Secondary | ICD-10-CM | POA: Insufficient documentation

## 2014-03-05 DIAGNOSIS — C3490 Malignant neoplasm of unspecified part of unspecified bronchus or lung: Secondary | ICD-10-CM

## 2014-03-05 DIAGNOSIS — J90 Pleural effusion, not elsewhere classified: Secondary | ICD-10-CM | POA: Insufficient documentation

## 2014-03-05 DIAGNOSIS — R5383 Other fatigue: Secondary | ICD-10-CM

## 2014-03-05 DIAGNOSIS — T451X5A Adverse effect of antineoplastic and immunosuppressive drugs, initial encounter: Secondary | ICD-10-CM

## 2014-03-05 DIAGNOSIS — I700 Atherosclerosis of aorta: Secondary | ICD-10-CM | POA: Insufficient documentation

## 2014-03-05 DIAGNOSIS — C50919 Malignant neoplasm of unspecified site of unspecified female breast: Secondary | ICD-10-CM

## 2014-03-05 DIAGNOSIS — C3410 Malignant neoplasm of upper lobe, unspecified bronchus or lung: Secondary | ICD-10-CM

## 2014-03-05 DIAGNOSIS — C7800 Secondary malignant neoplasm of unspecified lung: Secondary | ICD-10-CM

## 2014-03-05 DIAGNOSIS — K5900 Constipation, unspecified: Secondary | ICD-10-CM

## 2014-03-05 DIAGNOSIS — C7970 Secondary malignant neoplasm of unspecified adrenal gland: Secondary | ICD-10-CM | POA: Insufficient documentation

## 2014-03-05 DIAGNOSIS — K5730 Diverticulosis of large intestine without perforation or abscess without bleeding: Secondary | ICD-10-CM | POA: Insufficient documentation

## 2014-03-05 DIAGNOSIS — Z9221 Personal history of antineoplastic chemotherapy: Secondary | ICD-10-CM | POA: Insufficient documentation

## 2014-03-05 DIAGNOSIS — D702 Other drug-induced agranulocytosis: Secondary | ICD-10-CM

## 2014-03-05 DIAGNOSIS — R5381 Other malaise: Secondary | ICD-10-CM

## 2014-03-05 DIAGNOSIS — I708 Atherosclerosis of other arteries: Secondary | ICD-10-CM | POA: Insufficient documentation

## 2014-03-05 DIAGNOSIS — Z923 Personal history of irradiation: Secondary | ICD-10-CM | POA: Insufficient documentation

## 2014-03-05 LAB — CBC WITH DIFFERENTIAL/PLATELET
BASO%: 1.1 % (ref 0.0–2.0)
Basophils Absolute: 0 10*3/uL (ref 0.0–0.1)
EOS%: 1.1 % (ref 0.0–7.0)
Eosinophils Absolute: 0 10*3/uL (ref 0.0–0.5)
HCT: 32.5 % — ABNORMAL LOW (ref 38.4–49.9)
HGB: 11 g/dL — ABNORMAL LOW (ref 13.0–17.1)
LYMPH%: 35.6 % (ref 14.0–49.0)
MCH: 30.7 pg (ref 27.2–33.4)
MCHC: 33.8 g/dL (ref 32.0–36.0)
MCV: 90.8 fL (ref 79.3–98.0)
MONO#: 0.1 10*3/uL (ref 0.1–0.9)
MONO%: 13.3 % (ref 0.0–14.0)
NEUT%: 48.9 % (ref 39.0–75.0)
NEUTROS ABS: 0.4 10*3/uL — AB (ref 1.5–6.5)
Platelets: 64 10*3/uL — ABNORMAL LOW (ref 140–400)
RBC: 3.58 10*6/uL — ABNORMAL LOW (ref 4.20–5.82)
RDW: 14.3 % (ref 11.0–14.6)
WBC: 0.9 10*3/uL — AB (ref 4.0–10.3)
lymph#: 0.3 10*3/uL — ABNORMAL LOW (ref 0.9–3.3)
nRBC: 0 % (ref 0–0)

## 2014-03-05 LAB — COMPREHENSIVE METABOLIC PANEL (CC13)
ALT: 52 U/L (ref 0–55)
ANION GAP: 13 meq/L — AB (ref 3–11)
AST: 79 U/L — AB (ref 5–34)
Albumin: 2.5 g/dL — ABNORMAL LOW (ref 3.5–5.0)
Alkaline Phosphatase: 171 U/L — ABNORMAL HIGH (ref 40–150)
BILIRUBIN TOTAL: 1.59 mg/dL — AB (ref 0.20–1.20)
BUN: 16.5 mg/dL (ref 7.0–26.0)
CO2: 21 mEq/L — ABNORMAL LOW (ref 22–29)
CREATININE: 0.8 mg/dL (ref 0.7–1.3)
Calcium: 8.7 mg/dL (ref 8.4–10.4)
Chloride: 101 mEq/L (ref 98–109)
Glucose: 123 mg/dl (ref 70–140)
Potassium: 3.9 mEq/L (ref 3.5–5.1)
Sodium: 136 mEq/L (ref 136–145)
Total Protein: 6.1 g/dL — ABNORMAL LOW (ref 6.4–8.3)

## 2014-03-05 LAB — TECHNOLOGIST REVIEW

## 2014-03-05 LAB — MAGNESIUM (CC13): Magnesium: 1.4 mg/dl — CL (ref 1.5–2.5)

## 2014-03-05 MED ORDER — TBO-FILGRASTIM 300 MCG/0.5ML ~~LOC~~ SOSY
300.0000 ug | PREFILLED_SYRINGE | Freq: Once | SUBCUTANEOUS | Status: AC
  Administered 2014-03-05: 300 ug via SUBCUTANEOUS
  Filled 2014-03-05: qty 0.5

## 2014-03-05 MED ORDER — SODIUM CHLORIDE 0.9 % IV SOLN
Freq: Once | INTRAVENOUS | Status: AC
  Administered 2014-03-05: 11:00:00 via INTRAVENOUS

## 2014-03-05 MED ORDER — ALBUTEROL SULFATE HFA 108 (90 BASE) MCG/ACT IN AERS: 2.0000 | INHALATION_SPRAY | Freq: Four times a day (QID) | RESPIRATORY_TRACT | Status: AC | PRN

## 2014-03-05 MED ORDER — IOHEXOL 300 MG/ML  SOLN
100.0000 mL | Freq: Once | INTRAMUSCULAR | Status: AC | PRN
  Administered 2014-03-05: 100 mL via INTRAVENOUS

## 2014-03-05 MED ORDER — MOXIFLOXACIN HCL 400 MG PO TABS: 400.0000 mg | ORAL_TABLET | Freq: Every day | ORAL | Status: AC

## 2014-03-05 NOTE — Telephone Encounter (Signed)
per 3/18 pof cx all appts. appts for 3/25 cx'd - no other appts on schedule.

## 2014-03-05 NOTE — Patient Instructions (Signed)
Your CT of your abdomen and pelvis revealed further disease progression. We are discontinuing chemotherapy at this time and as we discussed at length with you and your wife, referring you to hospice care.

## 2014-03-05 NOTE — Progress Notes (Signed)
23 G in right AC in place for CT scan. Patient was able to drink 1.5 of 2 bottles of contrast. Taken via w/c to CT.

## 2014-03-05 NOTE — Patient Instructions (Signed)
Dehydration, Adult Dehydration is when you lose more fluids from the body than you take in. Vital organs like the kidneys, brain, and heart cannot function without a proper amount of fluids and salt. Any loss of fluids from the body can cause dehydration.  CAUSES   Vomiting.  Diarrhea.  Excessive sweating.  Excessive urine output.  Fever. SYMPTOMS  Mild dehydration  Thirst.  Dry lips.  Slightly dry mouth. Moderate dehydration  Very dry mouth.  Sunken eyes.  Skin does not bounce back quickly when lightly pinched and released.  Dark urine and decreased urine production.  Decreased tear production.  Headache. Severe dehydration  Very dry mouth.  Extreme thirst.  Rapid, weak pulse (more than 100 beats per minute at rest).  Cold hands and feet.  Not able to sweat in spite of heat and temperature.  Rapid breathing.  Blue lips.  Confusion and lethargy.  Difficulty being awakened.  Minimal urine production.  No tears. DIAGNOSIS  Your caregiver will diagnose dehydration based on your symptoms and your exam. Blood and urine tests will help confirm the diagnosis. The diagnostic evaluation should also identify the cause of dehydration. TREATMENT  Treatment of mild or moderate dehydration can often be done at home by increasing the amount of fluids that you drink. It is best to drink small amounts of fluid more often. Drinking too much at one time can make vomiting worse. Refer to the home care instructions below. Severe dehydration needs to be treated at the hospital where you will probably be given intravenous (IV) fluids that contain water and electrolytes. HOME CARE INSTRUCTIONS   Ask your caregiver about specific rehydration instructions.  Drink enough fluids to keep your urine clear or pale yellow.  Drink small amounts frequently if you have nausea and vomiting.  Eat as you normally do.  Avoid:  Foods or drinks high in sugar.  Carbonated  drinks.  Juice.  Extremely hot or cold fluids.  Drinks with caffeine.  Fatty, greasy foods.  Alcohol.  Tobacco.  Overeating.  Gelatin desserts.  Wash your hands well to avoid spreading bacteria and viruses.  Only take over-the-counter or prescription medicines for pain, discomfort, or fever as directed by your caregiver.  Ask your caregiver if you should continue all prescribed and over-the-counter medicines.  Keep all follow-up appointments with your caregiver. SEEK MEDICAL CARE IF:  You have abdominal pain and it increases or stays in one area (localizes).  You have a rash, stiff neck, or severe headache.  You are irritable, sleepy, or difficult to awaken.  You are weak, dizzy, or extremely thirsty. SEEK IMMEDIATE MEDICAL CARE IF:   You are unable to keep fluids down or you get worse despite treatment.  You have frequent episodes of vomiting or diarrhea.  You have blood or green matter (bile) in your vomit.  You have blood in your stool or your stool looks black and tarry.  You have not urinated in 6 to 8 hours, or you have only urinated a small amount of very dark urine.  You have a fever.  You faint. MAKE SURE YOU:   Understand these instructions.  Will watch your condition.  Will get help right away if you are not doing well or get worse. Document Released: 12/05/2005 Document Revised: 02/27/2012 Document Reviewed: 07/25/2011 Exit[employer] Patient Information 2014 [employer].  

## 2014-03-05 NOTE — Progress Notes (Addendum)
[place] [phone]   Fax:([phone]) [place], MD [place] [facility] [place] [address]  DIAGNOSIS: Extensive stage small cell lung cancer diagnosed in August of 2014   PRIOR THERAPY:  1) Status post palliative radiotherapy to the mediastinal lymphadenopathy under the care of Dr. Lisbeth Renshaw. 2) Systemic chemotherapy with carboplatin for AUC of 5 on day 1 and etoposide at 120 mg/M2 on days 1, 2 and 3 with Neulasta support on day 4. First cycle given on 08/12/2013. He is status post 4 cycles discontinued today secondary to disease progression.  3) prophylactic cranial irradiation completed 12/04/2013 4) palliative radiotherapy to the enlarging upper abdominal metastatic lymphadenopathy as well as the right adrenal metastasis under the care of Dr. Lisbeth Renshaw completed 12/04/2013  CURRENT THERAPY: Systemic chemotherapy with cisplatin 30 mg/M2 and irinotecan 65 mg/M2 on days 1 and 8 every 3 weeks. First dose 02/14/2014. Status post 1 cycle  CHEMOTHERAPY INTENT: Palliative  CURRENT # OF CHEMOTHERAPY CYCLES: 1 CURRENT ANTIEMETICS: Zofran, dexamethasone and Compazine  CURRENT SMOKING STATUS: Quit smoking  ORAL CHEMOTHERAPY AND CONSENT: None  CURRENT BISPHOSPHONATES USE: None  PAIN MANAGEMENT: 0/10  NARCOTICS INDUCED CONSTIPATION: None  LIVING WILL AND CODE STATUS: Full code   INTERVAL HISTORY: Ray Burns 53 y.o. male returns to the clinic today for followup visit accompanied by his wife. He complains of abdominal pain to the point that it was difficult to sleep secondary to pain. He complains of constipation, not having a good bowel movement and proximally 4 days. He is tried suppositories, enemas and stool softeners without significant relief. He will have small amounts of "pasty" bowel movements. He denied any blood in his urine or stool no nosebleeds no hematemesis. He does not feel up to proceeding with  chemotherapy today. He has developed some wheezing recently which is a new symptom for him. He also has some shortness of breath. He overall feels weak. He denied having any night sweats. The patient denied having any chest pain,  cough or hemoptysis. He has no fever or chills, no vomiting. He reports decreased by mouth intake of food and fluids.  MEDICAL HISTORY: Past Medical History  Diagnosis Date  . GERD (gastroesophageal reflux disease)     mild  . Non-small cell lung cancer   . Hx of radiation therapy 08/05/13-08/26/13    chest/lung cancer/ 37.5Gy    ALLERGIES:  has No Known Allergies.  MEDICATIONS:  Current Outpatient Prescriptions  Medication Sig Dispense Refill  . omeprazole (PRILOSEC OTC) 20 MG tablet Take 20 mg by mouth daily.      Marland Kitchen oxyCODONE (OXY IR/ROXICODONE) 5 MG immediate release tablet Take 1 tablet (5 mg total) by mouth every 4 (four) hours as needed for severe pain.  60 tablet  0  . traMADol (ULTRAM) 50 MG tablet Take by mouth every 6 (six) hours as needed. On call doctor prescribed      . acetaminophen (TYLENOL) 500 MG tablet Take 500 mg by mouth every 6 (six) hours as needed for pain.      Marland Kitchen albuterol (PROVENTIL HFA;VENTOLIN HFA) 108 (90 BASE) MCG/ACT inhaler Inhale 2 puffs into the lungs every 6 (six) hours as needed for wheezing or shortness of breath.  1 Inhaler  2  . ibuprofen (ADVIL,MOTRIN) 200 MG tablet Take 1 tablet by mouth.      . methylPREDNIsolone (MEDROL DOSPACK) 4 MG tablet follow package directions  21  tablet  0  . moxifloxacin (AVELOX) 400 MG tablet Take 1 tablet (400 mg total) by mouth daily at 8 pm.  5 tablet  0  . prochlorperazine (COMPAZINE) 10 MG tablet Take 1 tablet (10 mg total) by mouth every 6 (six) hours as needed for nausea or vomiting.  30 tablet  0   No current facility-administered medications for this visit.    SURGICAL HISTORY:  Past Surgical History  Procedure Laterality Date  . Appendectomy      53 y/o  . Video bronchoscopy  Bilateral 07/25/2013    Procedure: VIDEO BRONCHOSCOPY WITHOUT FLUORO;  Surgeon: Tanda Rockers, MD;  Location: [facility];  Service: Cardiopulmonary;  Laterality: Bilateral;    REVIEW OF SYSTEMS:  Constitutional: positive for anorexia, fatigue, malaise and weight loss Eyes: negative Ears, nose, mouth, throat, and face: negative Respiratory: positive for wheezing Cardiovascular: negative Gastrointestinal: positive for abdominal pain, constipation and odynophagia Genitourinary:negative Integument/breast: negative Hematologic/lymphatic: negative Musculoskeletal:negative Neurological: negative Behavioral/Psych: negative Endocrine: negative Allergic/Immunologic: negative   PHYSICAL EXAMINATION: General appearance: alert, cooperative and no distress Head: Normocephalic, without obvious abnormality, atraumatic Neck: no adenopathy, no JVD, supple, symmetrical, trachea midline and thyroid not enlarged, symmetric, no tenderness/mass/nodules Lymph nodes: Cervical, supraclavicular, and axillary nodes normal. Resp: clear to auscultation bilaterally Back: symmetric, no curvature. ROM normal. No CVA tenderness. Cardio: regular rate and rhythm, S1, S2 normal, no murmur, click, rub or gallop and Tachycardic GI: Generalized and abdominal tenderness with the greatest concentration being in the mid abdomen, no rebound or referred pain. Unable to ascertain hepatosplenomegaly secondary to the patient's discomfort. Scant bowel sounds heard in all quadrants Extremities: extremities normal, atraumatic, no cyanosis or edema Neurologic: Alert and oriented X 3, normal strength and tone. Normal symmetric reflexes. Normal coordination and gait Skin: Has a generalized gray hue Sclera are slightly icteric  ECOG PERFORMANCE STATUS: 1 - Symptomatic but completely ambulatory  Blood pressure 112/74, pulse 120, temperature 99.8 F (37.7 C), temperature source Oral, resp. rate 18, height 5\' 10"  (1.778 m), weight 167 lb  (75.751 kg), SpO2 97.00%.  LABORATORY DATA: Lab Results  Component Value Date   WBC 0.9* 03/05/2014   HGB 11.0* 03/05/2014   HCT 32.5* 03/05/2014   MCV 90.8 03/05/2014   PLT 64* 03/05/2014      Chemistry      Component Value Date/Time   NA 136 03/05/2014 0818   NA 136 07/09/2013 1446   K 3.9 03/05/2014 0818   K 4.6 07/09/2013 1446   CL 102 07/09/2013 1446   CO2 21* 03/05/2014 0818   CO2 27 07/09/2013 1446   BUN 16.5 03/05/2014 0818   BUN 17 07/09/2013 1446   CREATININE 0.8 03/05/2014 0818   CREATININE 1.0 07/09/2013 1446      Component Value Date/Time   CALCIUM 8.7 03/05/2014 0818   CALCIUM 9.8 07/09/2013 1446   ALKPHOS 171* 03/05/2014 0818   ALKPHOS 62 07/09/2013 1446   AST 79* 03/05/2014 0818   AST 24 07/09/2013 1446   ALT 52 03/05/2014 0818   ALT 22 07/09/2013 1446   BILITOT 1.59* 03/05/2014 0818   BILITOT 0.8 07/09/2013 1446       RADIOGRAPHIC STUDIES: Ct Head W Wo Contrast  02/12/2014   CLINICAL DATA:  Non-small cell lung cancer.  EXAM: CT HEAD WITHOUT AND WITH CONTRAST  TECHNIQUE: Contiguous axial images were obtained from the base of the skull through the vertex without and with intravenous contrast  CONTRAST:  166mL OMNIPAQUE IOHEXOL 300 MG/ML  SOLN  COMPARISON:  MRI brain 08/07/2013.  FINDINGS: Two new foci of hypoattenuation are present within the right cerebellum. These likely represent age-indeterminate infarcts. There is no pathologic enhancement associated.  Postcontrast images demonstrate no pathologic enhancement to suggest metastatic disease to the brain or meninges. Atrophy and white matter disease is otherwise stable. No acute hemorrhage or mass lesion is present.  The left maxillary sinus, anterior ethmoid air cells, the left maxillary sinus, anterior ethmoid air cells, and inferior left frontal sinus are opacified. There is partial opacification of anterior right ethmoid air cells.  Mastoid air cells are clear.  The osseous skull is intact.  IMPRESSION: 1. Two new areas of  hypoattenuation within the right cerebellum compatible with age indeterminate infarcts. 2. No evidence for metastatic disease the brain. 3. Otherwise stable atrophy and white matter disease.   Electronically Signed   By: Lawrence Santiago M.D.   On: 02/12/2014 16:06   Ct Chest W Contrast  02/12/2014   CLINICAL DATA:  Extensive stage small-cell lung cancer. History of radiation therapy. Status post chemotherapy. Status post 4 cycles.  EXAM: CT CHEST, ABDOMEN, AND PELVIS WITH CONTRAST  TECHNIQUE: Multidetector CT imaging of the chest, abdomen and pelvis was performed following the standard protocol during bolus administration of intravenous contrast.  CONTRAST:  162mL OMNIPAQUE IOHEXOL 300 MG/ML  SOLN  COMPARISON:  CT ABD/PELVIS W CM dated 01/01/2014  FINDINGS: CT CHEST FINDINGS  Lungs/Pleura: Innumerable pulmonary nodules and masses. Index right upper lobe pulmonary mass measures 3.0 cm on image 19 versus 2.3 cm on the prior  Index right lower lobe lung nodule measures 2.7 x 2.4 cm on image 36 versus 1.9 x 1.5 cm on the prior  Left lower lobe lung nodule measures 2.9 x 2.3 cm on image 39 versus 2.2 x 2.0 cm on the prior.  Presumed radiation change in the paramediastinal right lung. No pleural fluid.  Heart/Mediastinum: No supraclavicular adenopathy. A right axillary node is newly enlarged 1.3 cm and rounded, suggesting malignancy. Right paratracheal node is newly enlarged 1.4 cm on image 20  Similar subcarinal adenopathy at 2.8 cm.  Progressive left infrahilar adenopathy at 2.0 x 3.1 cm today versus 1.6 cm on the prior.  Normal heart size, without pericardial effusion. No central pulmonary embolism, on this non-dedicated study.  CT ABDOMEN AND PELVIS FINDINGS  Abdomen/Pelvis: Progression of hepatic metastasis. Index lesion straddling the anterior segment right and medial segment left liver lobes measures 7.5 cm today versus 3.1 cm on the prior. Index high left lateral segment liver lobe 3.1 x 4.2 cm lesion on image 47  measures 2.0 cm on the prior.  A splenule. Old granulomatous disease in the spleen. Normal stomach, pancreas, gallbladder, biliary tract. Mild left adrenal nodularity. Right adrenal nodule is enlarged at 2.2 x 2.4 cm today versus 1.5 x 1.4 cm on the prior.  Normal left kidney. Too small to characterize right renal lesion which is likely a cyst.  Progressive retroperitoneal adenopathy. Left periaortic 2.3 x 3.4 cm node on image 64 is increased from 2.0 x 1.6 cm on the prior.  Scattered colonic diverticula. Normal terminal ileum. 4.8 x 5.2 cm gastrohepatic ligament node is enlarged from 4.9 x 3.7 cm on the prior. Progressive mesenteric adenopathy, including on image 86. No bowel obstruction or ascites. No pelvic adenopathy. Normal urinary bladder and prostate. No significant free fluid.  Bones/Musculoskeletal: Disc bulge at the lumbosacral junction and the T11-12 level.  IMPRESSION: Since 01/01/2014, marked progression of metastatic disease, including within the lungs,  liver, thoracoabdominal nodal stations, and adrenal glands.   Electronically Signed   By: Abigail Miyamoto M.D.   On: 02/12/2014 16:13   Ct Abdomen Pelvis W Contrast  03/05/2014   CLINICAL DATA:  Restaging metastatic lung cancer diagnosed in August 2014. Chemotherapy and radiation therapy completed.  EXAM: CT ABDOMEN AND PELVIS WITH CONTRAST  TECHNIQUE: Multidetector CT imaging of the abdomen and pelvis was performed using the standard protocol following bolus administration of intravenous contrast.  CONTRAST:  12mL OMNIPAQUE IOHEXOL 300 MG/ML  SOLN  COMPARISON:  CT ABD/PELVIS W CM dated 02/12/2014; CT ABD/PELVIS W CM dated 01/01/2014  FINDINGS: Images through the lung bases again demonstrate multiple pulmonary metastases, enlarged from January, but not significantly changed from the more recent chest CT. A representative nodule in the left lower lobe measures 2.8 cm on image 11. Patient has developed a small right pleural effusion. Partially calcified  mediastinal and left hilar lymph nodes are grossly stable.  There has been further slight progression in the multifocal widespread hepatic metastatic disease. I would estimate that greater than 50% of the hepatic parenchyma is now replaced by tumor. There is a large lesion in the dome of the right hepatic lobe measuring 7.3 x 6.2 cm on image 10. Two months ago, this measured 3.1 cm maximally, and last month 6.1 cm. The referenced lesion more inferiorly in the right lobe measures 8.1 cm on image 18 (previously 7.5 cm). In the lateral segment of the left hepatic lobe is a 5.9 x 4.9 cm lesion on image 22.  A right adrenal metastasis has slightly enlarged, measuring 3.7 cm on image 25. The left adrenal gland appears normal. The spleen, pancreas, gallbladder and kidneys demonstrate no acute findings. There is a stable cyst in the upper pole of the right kidney. The gallbladder is contracted.  Multiple nodal masses within the upper abdomen are again noted. Within the gastrohepatic ligament is a 5.1 x 4.3 cm nodal mass on image 26. This measured 4.9 x 3.7 cm on 01/01/2014 and 5.2 x 4.8 cm last month. There are multiple enlarging retroperitoneal and mesenteric lymph nodes as well, including a 4.7 x 2.5 cm preaortic node on image 37 and a 2.5 cm mesenteric node on image 53. No enlarged lymph nodes are identified within the pelvis. There is no ascites.  Small bowel loops in the pelvis are mildly dilated, although there is no focal transition point. There does appear to be focal small bowel wall thickening in the left mid abdomen (axial image 57) ; no definite transition in bowel caliber is identified in this region to suggest obstruction. The colon appears normal aside from mild diverticulosis distally. The bladder, prostate gland and seminal vesicles appear stable. There is stable aortoiliac atherosclerosis.  There are stable degenerative changes in both hips. No worrisome osseous findings are demonstrated.  IMPRESSION: 1.  Progressive widespread metastatic disease to the lungs, liver, right adrenal gland and multiple abdominal lymph nodes as described. 2. New small right pleural effusion. 3. New mild small bowel distension with possible mid small bowel focal wall thickening. No focal transition point identified to suggest high-grade obstruction.   Electronically Signed   By: Camie Patience M.D.   On: 03/05/2014 15:01   Ct Abdomen Pelvis W Contrast  02/12/2014   CLINICAL DATA:  Extensive stage small-cell lung cancer. History of radiation therapy. Status post chemotherapy. Status post 4 cycles.  EXAM: CT CHEST, ABDOMEN, AND PELVIS WITH CONTRAST  TECHNIQUE: Multidetector CT imaging of the chest, abdomen  and pelvis was performed following the standard protocol during bolus administration of intravenous contrast.  CONTRAST:  142mL OMNIPAQUE IOHEXOL 300 MG/ML  SOLN  COMPARISON:  CT ABD/PELVIS W CM dated 01/01/2014  FINDINGS: CT CHEST FINDINGS  Lungs/Pleura: Innumerable pulmonary nodules and masses. Index right upper lobe pulmonary mass measures 3.0 cm on image 19 versus 2.3 cm on the prior  Index right lower lobe lung nodule measures 2.7 x 2.4 cm on image 36 versus 1.9 x 1.5 cm on the prior  Left lower lobe lung nodule measures 2.9 x 2.3 cm on image 39 versus 2.2 x 2.0 cm on the prior.  Presumed radiation change in the paramediastinal right lung. No pleural fluid.  Heart/Mediastinum: No supraclavicular adenopathy. A right axillary node is newly enlarged 1.3 cm and rounded, suggesting malignancy. Right paratracheal node is newly enlarged 1.4 cm on image 20  Similar subcarinal adenopathy at 2.8 cm.  Progressive left infrahilar adenopathy at 2.0 x 3.1 cm today versus 1.6 cm on the prior.  Normal heart size, without pericardial effusion. No central pulmonary embolism, on this non-dedicated study.  CT ABDOMEN AND PELVIS FINDINGS  Abdomen/Pelvis: Progression of hepatic metastasis. Index lesion straddling the anterior segment right and medial  segment left liver lobes measures 7.5 cm today versus 3.1 cm on the prior. Index high left lateral segment liver lobe 3.1 x 4.2 cm lesion on image 47 measures 2.0 cm on the prior.  A splenule. Old granulomatous disease in the spleen. Normal stomach, pancreas, gallbladder, biliary tract. Mild left adrenal nodularity. Right adrenal nodule is enlarged at 2.2 x 2.4 cm today versus 1.5 x 1.4 cm on the prior.  Normal left kidney. Too small to characterize right renal lesion which is likely a cyst.  Progressive retroperitoneal adenopathy. Left periaortic 2.3 x 3.4 cm node on image 64 is increased from 2.0 x 1.6 cm on the prior.  Scattered colonic diverticula. Normal terminal ileum. 4.8 x 5.2 cm gastrohepatic ligament node is enlarged from 4.9 x 3.7 cm on the prior. Progressive mesenteric adenopathy, including on image 86. No bowel obstruction or ascites. No pelvic adenopathy. Normal urinary bladder and prostate. No significant free fluid.  Bones/Musculoskeletal: Disc bulge at the lumbosacral junction and the T11-12 level.  IMPRESSION: Since 01/01/2014, marked progression of metastatic disease, including within the lungs, liver, thoracoabdominal nodal stations, and adrenal glands.   Electronically Signed   By: Abigail Miyamoto M.D.   On: 02/12/2014 16:13   ASSESSMENT AND PLAN: This is a very pleasant 53 years old white male with extensive stage small cell lung cancer status post palliative radiotherapy to the mediastinal lymphadenopathy as well as 4 cycles of systemic chemotherapy with carboplatin and etoposide. This was followed by prophylactic cranial irradiation as well as palliative radiotherapy to the enlarging upper abdominal lymphadenopathy as well as the right adrenal metastasis. Unfortunately his last scan showed evidence for significant disease progression involving the chest and abdomen as well as new liver lesions. Is currently being treated with systemic chemotherapy with cisplatin 30 mg/M2 and irinotecan 65  mg/M2 on days 1 and 8 every 3 weeks, status post 1 cycle. Patient presented today for cycle #2 of his systemic chemotherapy versus cisplatin and irinotecan. He did not feel up to receiving chemotherapy. Additionally patient was found to be neutropenic with a total white count of 0.9 and an ANC of 0.4 with platelets are 64,000 would not be able to receive chemotherapy based on his counts even if he did feel up to receiving treatment  today. He was given a liter of normal saline as he was a bit tachycardic and slightly hypotensive when compared to his baseline blood pressure readings. Patient was discussed with also seen by Dr. Julien Nordmann. To further evaluate his complaints of abdominal pain a stat CT the abdomen pelvis was obtained and unfortunately revealed further disease progression. We had a discussion with the patient his wife regarding the CT scan results and recommended discontinuing chemotherapy at this time as he was progressing through chemotherapy. He would be simply to the side effects from chemotherapy with now receiving much benefit. We further recommended patient to proceed with hospice therapy. He and his wife are in agreement and fully understood his current disease state. He is neutropenic today we will give him one injection of gram except 300 mcg subcutaneously for neutrophil support. We will also place him on an empiric course of Avelox at 400 mg by mouth daily for 5 days. For the wheezing he'll be started on an albuterol inhaler, 2+ by mouth every 6 hours as needed for wheezing. Prescription for the Avelox and albuterol inhaler were sent to his pharmacy of record via E. scribed.  For pain management the patient will continue on oxycodone 5 mg by mouth every 6 hours as needed for pain.  The patient will be referred to [facility].   The patient and his wife agreed to the current plan. He was advised to call immediately if he has any concerning symptoms in the interval. The  patient voices understanding of current disease status and treatment options and is in agreement with the current care plan.  All questions were answered. The patient knows to call the clinic with any problems, questions or concerns. We can certainly see the patient much sooner if necessary.  Carlton Adam PA-C  ADDENDUM: Hematology/Oncology Attending: I had a face to face encounter with the patient today. I recommended his care plan. This is a very unfortunate 53 years old white male who was diagnosed with extensive stage small cell lung cancerin August of 2014 status post palliative radiotherapy as well as systemic chemotherapy with carboplatin and etoposide followed by prophylactic cranial irradiation as well as palliative radiotherapy to the enlarging upper lobe lung nodule as well as a right adrenal metastasis. He was found to have disease progression recently and the patient was started on second line chemotherapy with cisplatin and irinotecan is status post 1 cycle. He continues to complain of increasing fatigue and weakness as well as abdominal pain and constipation. His performance status is declining rapidly. The patient was in the clinic today accompanied by his wife. Repeat CT scan of the abdomen today showed evidence for further disease progression with almost replacement of his liver by tumor.  I have a lengthy discussion with the patient and his wife about his current disease status and treatment options.  I strongly recommended for the patient at this point to consider palliative care and hospice. The patient and his wife agreed to the current plan.  We'll continue with supportive management at this point including pain medication and laxatives. I would see him an as-needed basis. The patient and his wife knows to call immediately if there is any concerning symptoms.  Disclaimer: This note was dictated with voice recognition software. Similar sounding words can inadvertently be  transcribed and may not be corrected upon review.  Eilleen Kempf., MD 03/05/2014

## 2014-03-07 ENCOUNTER — Other Ambulatory Visit: Payer: BC Managed Care – PPO

## 2014-03-07 ENCOUNTER — Telehealth: Payer: Self-pay | Admitting: Medical Oncology

## 2014-03-07 NOTE — Telephone Encounter (Signed)
Wife has not heard from Hospice . I called [facility] and referred pt and faxed records.

## 2014-03-10 ENCOUNTER — Other Ambulatory Visit: Payer: Self-pay | Admitting: Internal Medicine

## 2014-03-12 ENCOUNTER — Ambulatory Visit: Payer: BC Managed Care – PPO

## 2014-03-12 ENCOUNTER — Other Ambulatory Visit: Payer: BC Managed Care – PPO

## 2014-03-13 ENCOUNTER — Telehealth: Payer: Self-pay | Admitting: Medical Oncology

## 2014-03-13 NOTE — Telephone Encounter (Signed)
[place] ,RN stated pt wants to be DNR. Is it okay for Dr Georga Bora to sign DNR. I called and left message on Daltons phone that Dr Julien Nordmann said Dr Georga Bora can sign DNR.

## 2014-03-14 ENCOUNTER — Other Ambulatory Visit: Payer: BC Managed Care – PPO

## 2014-04-18 DEATH — deceased

## 2014-08-12 ENCOUNTER — Other Ambulatory Visit: Payer: Self-pay | Admitting: Pharmacist

## 2014-09-13 IMAGING — CT CT ABD-PELV W/ CM
1 of 2 series · 14 of 32 positions shown, 18 images · IV contrast (omnipaque)
Comparison: CT ABD/PELVIS W CM dated 02/12/2014; CT ABD/PELVIS W CM
dated 01/01/2014

CLINICAL DATA: Restaging metastatic lung cancer diagnosed in July 2013. Chemotherapy and radiation therapy completed.

EXAM:
CT ABDOMEN AND PELVIS WITH CONTRAST
TECHNIQUE: Multidetector CT imaging of the abdomen and pelvis was performed
using the standard protocol following bolus administration of
intravenous contrast.
CONTRAST:  100mL OMNIPAQUE IOHEXOL 300 MG/ML  SOLN

[Series 2: rtn ap with st · axial · 0.74mm/px · z∈[+753,+1203]mm · 14 of 100 slices shown, 18 images]
[im 5/100  soft-tissue]
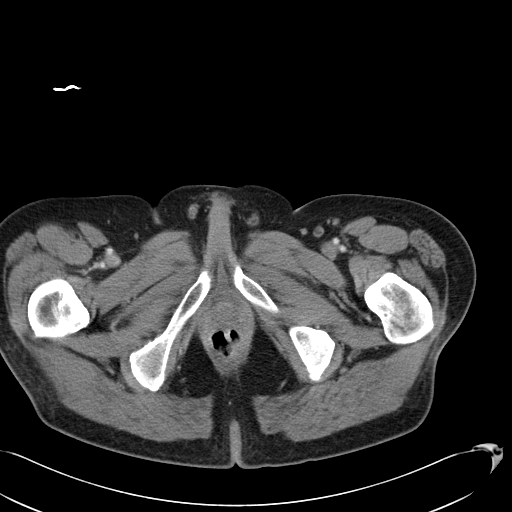
[im 5/100  bone]
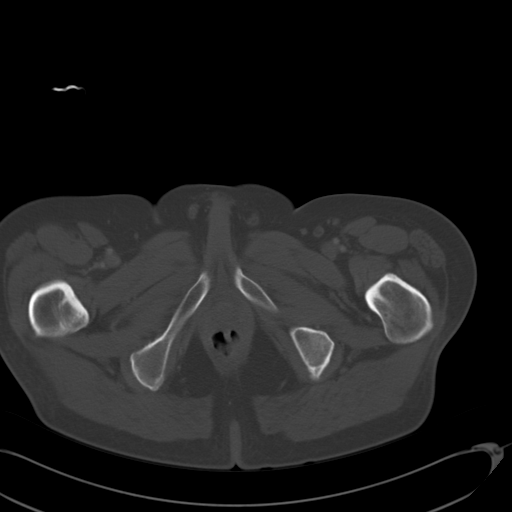
[im 13/100  soft-tissue]
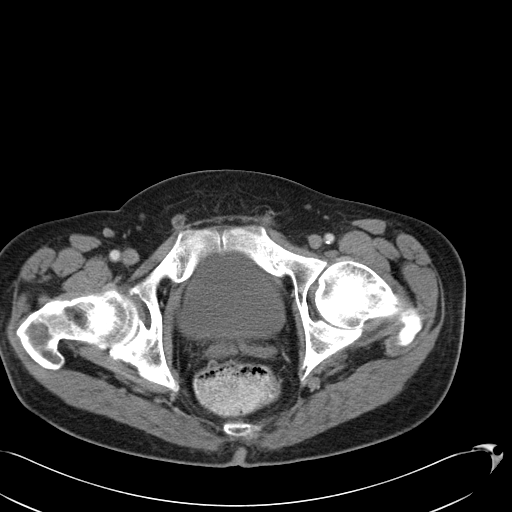
[im 22/100  soft-tissue]
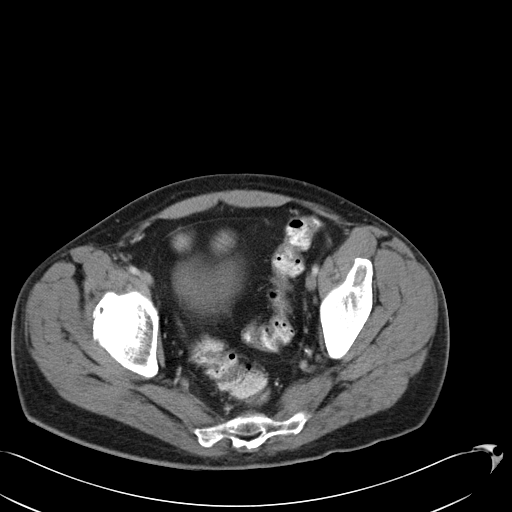
[im 31/100  soft-tissue]
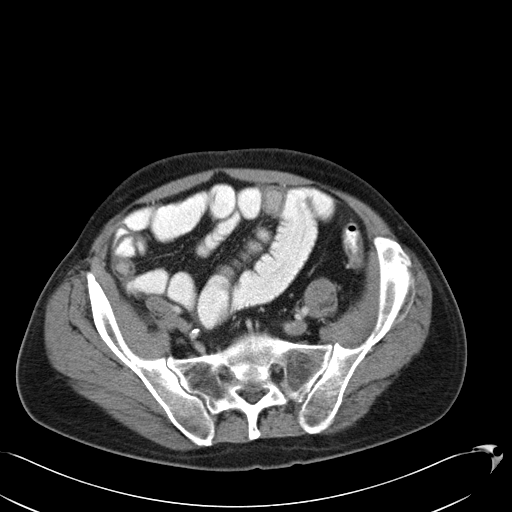
[im 39/100  soft-tissue]
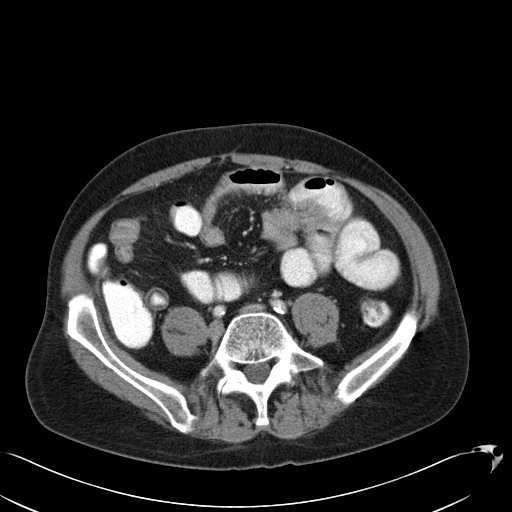
[im 48/100  soft-tissue]
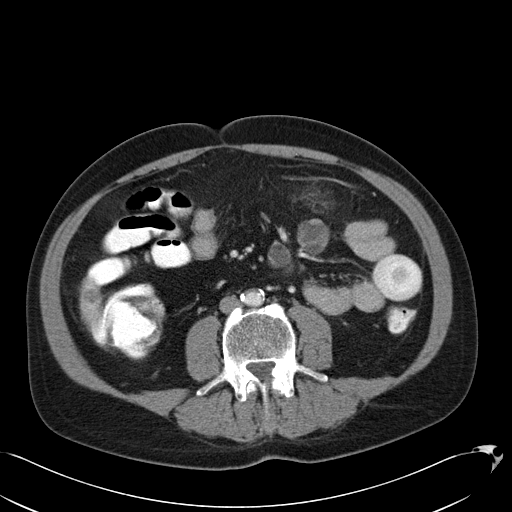
[im 52/100  soft-tissue]
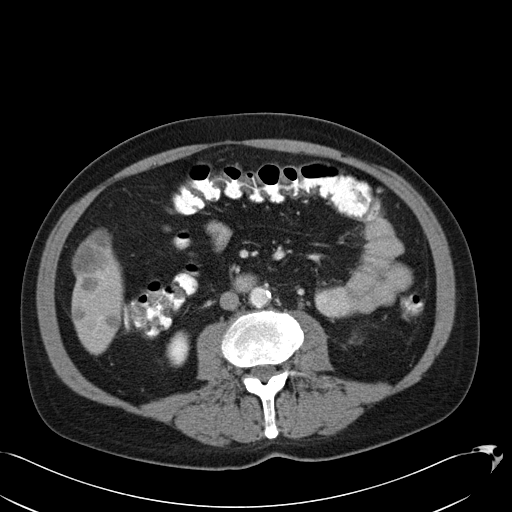
[im 61/100  soft-tissue]
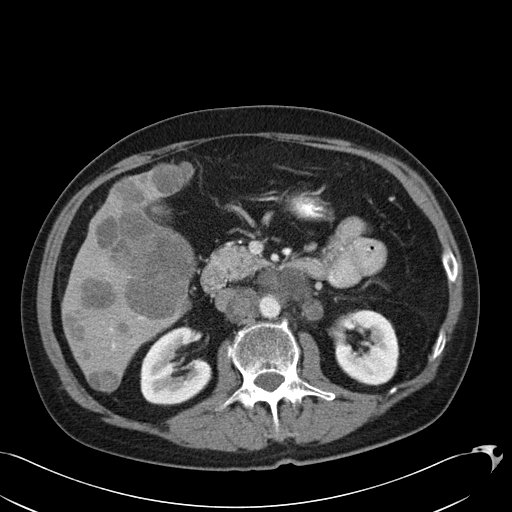
[im 69/100  soft-tissue]
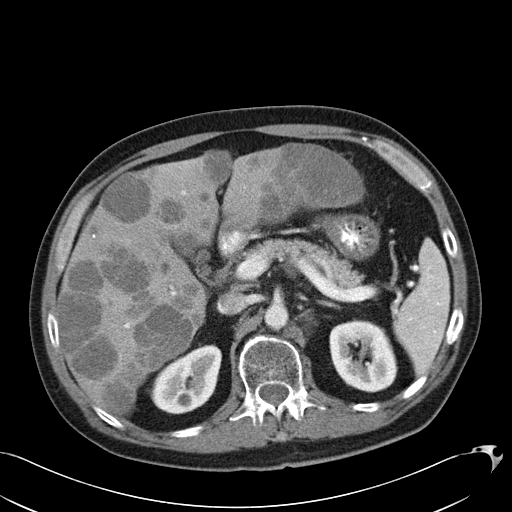
[im 69/100  bone]
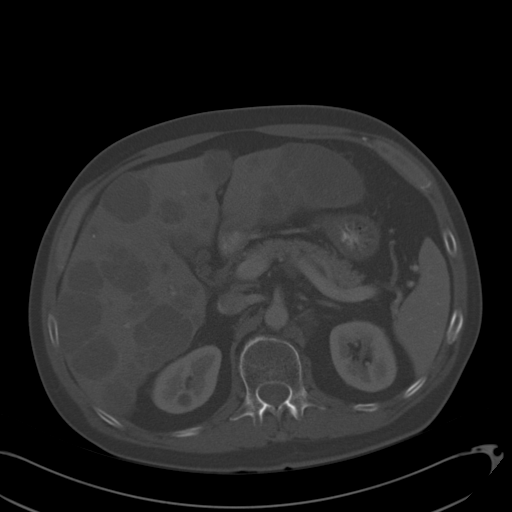
[im 78/100  soft-tissue]
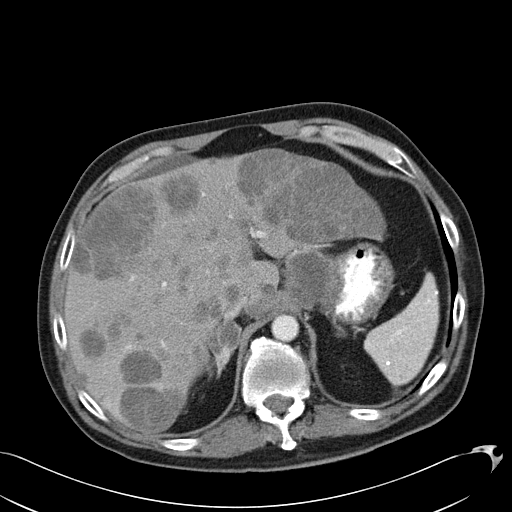
[im 82/100  lung]
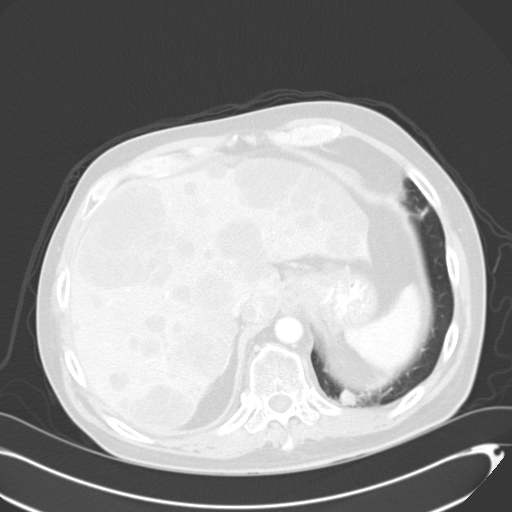
[im 87/100  soft-tissue]
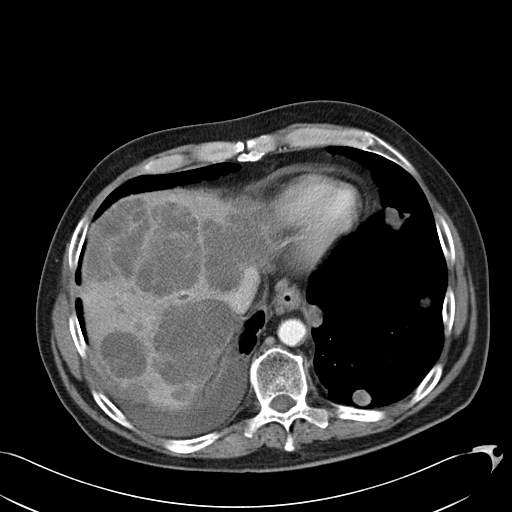
[im 87/100  lung]
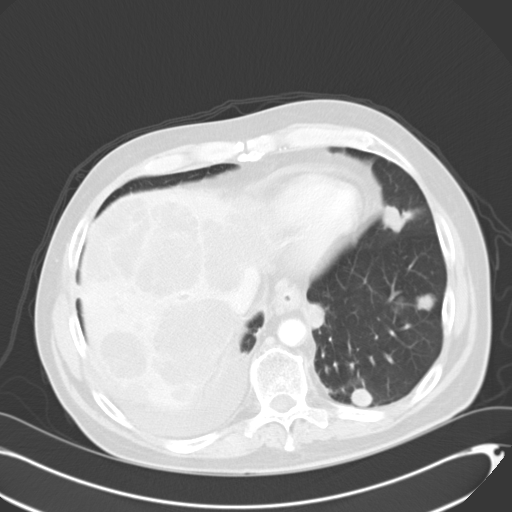
[im 91/100  lung]
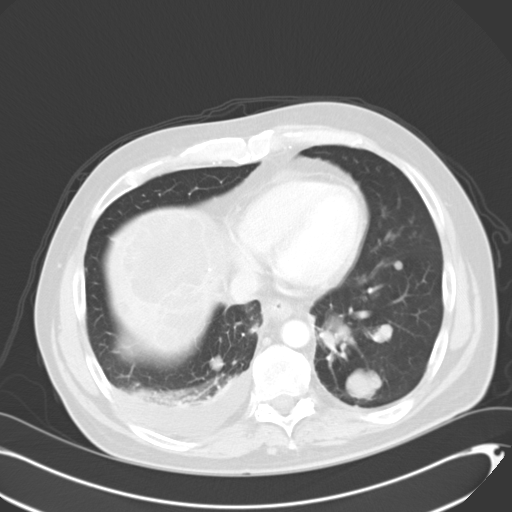
[im 95/100  soft-tissue]
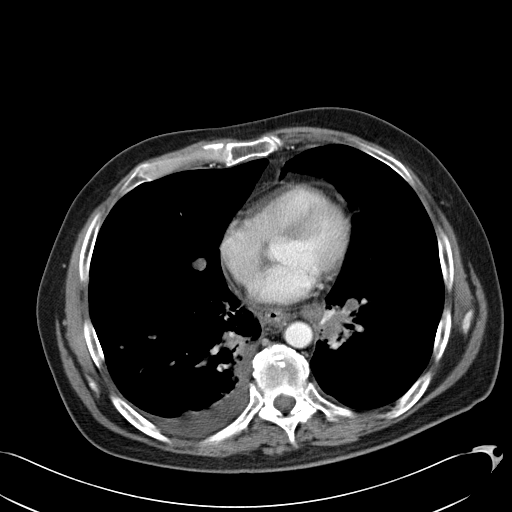
[im 95/100  lung]
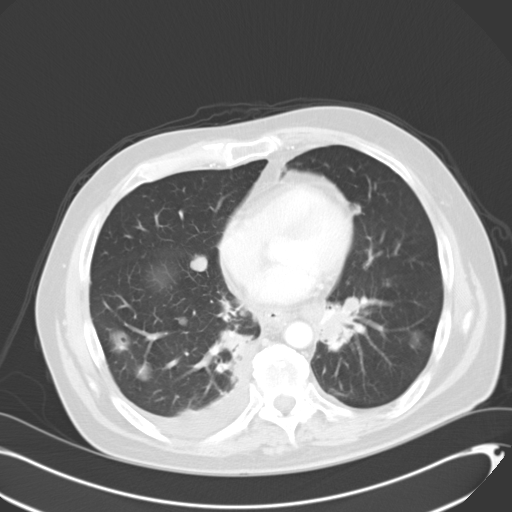

[14 of 32 positions shown; findings below may reference images not displayed]

FINDINGS: Images through the lung bases again demonstrate multiple pulmonary
metastases, enlarged from [place], but not significantly changed
from the more recent chest CT. A representative nodule in the left
lower lobe measures 2.8 cm on image 11. Patient has developed a
small right pleural effusion. Partially calcified mediastinal and
left hilar lymph nodes are grossly stable.

There has been further slight progression in the multifocal
widespread hepatic metastatic disease. I would estimate that greater
than 50% of the hepatic parenchyma is now replaced by tumor. There
is a large lesion in the dome of the right hepatic lobe measuring
7.3 x 6.2 cm on image 10. Two months ago, this measured 3.1 cm
maximally, and last month 6.1 cm. The referenced lesion more
inferiorly in the right lobe measures 8.1 cm on image 18 (previously
7.5 cm). In the lateral segment of the left hepatic lobe is a 5.9 x
4.9 cm lesion on image 22.

A right adrenal metastasis has slightly enlarged, measuring 3.7 cm
on image 25. The left adrenal gland appears normal. The spleen,
pancreas, gallbladder and kidneys demonstrate no acute findings.
There is a stable cyst in the upper pole of the right kidney. The
gallbladder is contracted.

Multiple nodal masses within the upper abdomen are again noted.
Within the gastrohepatic ligament is a 5.1 x 4.3 cm nodal mass on
image 26. This measured 4.9 x 3.7 cm on 01/01/2014 and 5.2 x 4.8 cm
last month. There are multiple enlarging retroperitoneal and
mesenteric lymph nodes as well, including a 4.7 x 2.5 cm preaortic
node on image 37 and a 2.5 cm mesenteric node on image 53. No
enlarged lymph nodes are identified within the pelvis. There is no
ascites.

Small bowel loops in the pelvis are mildly dilated, although there
is no focal transition point. There does appear to be focal small
bowel wall thickening in the left mid abdomen (axial image 57) ; no
definite transition in bowel caliber is identified in this region to
suggest obstruction. The colon appears normal aside from mild
diverticulosis distally. The bladder, prostate gland and seminal
vesicles appear stable. There is stable aortoiliac atherosclerosis.

There are stable degenerative changes in both hips. No worrisome
osseous findings are demonstrated.
IMPRESSION: 1. Progressive widespread metastatic disease to the lungs, liver,
right adrenal gland and multiple abdominal lymph nodes as described.
2. New small right pleural effusion.
3. New mild small bowel distension with possible mid small bowel
focal wall thickening. No focal transition point identified to
suggest high-grade obstruction.
